# Patient Record
Sex: Male | Born: 1965 | Race: White | Hispanic: No | Marital: Married | Smoking: Former smoker
Health system: Southern US, Community
[De-identification: ages and names within clinical notes are randomized; demographics above are authoritative.]

## PROBLEM LIST (undated history)

## (undated) DIAGNOSIS — K589 Irritable bowel syndrome without diarrhea: Secondary | ICD-10-CM

## (undated) DIAGNOSIS — E785 Hyperlipidemia, unspecified: Secondary | ICD-10-CM

## (undated) DIAGNOSIS — I491 Atrial premature depolarization: Secondary | ICD-10-CM

## (undated) HISTORY — DX: Atrial premature depolarization: I49.1

## (undated) HISTORY — DX: Irritable bowel syndrome, unspecified: K58.9

---

## 2005-07-06 ENCOUNTER — Encounter: Admission: RE | Admit: 2005-07-06 | Discharge: 2005-07-06 | Payer: Self-pay | Admitting: Family Medicine

## 2007-10-02 ENCOUNTER — Emergency Department (HOSPITAL_COMMUNITY): Admission: EM | Admit: 2007-10-02 | Discharge: 2007-10-02 | Payer: Self-pay | Admitting: Emergency Medicine

## 2008-12-27 HISTORY — PX: INGUINAL HERNIA REPAIR: SUR1180

## 2010-09-15 ENCOUNTER — Emergency Department (HOSPITAL_COMMUNITY): Admission: EM | Admit: 2010-09-15 | Discharge: 2010-09-16 | Payer: Self-pay | Admitting: Emergency Medicine

## 2011-03-11 LAB — DIFFERENTIAL
Basophils Absolute: 0 10*3/uL (ref 0.0–0.1)
Eosinophils Absolute: 0.2 10*3/uL (ref 0.0–0.7)
Eosinophils Relative: 2 % (ref 0–5)
Monocytes Absolute: 0.7 10*3/uL (ref 0.1–1.0)
Monocytes Relative: 7 % (ref 3–12)
Neutro Abs: 6.5 10*3/uL (ref 1.7–7.7)
Neutrophils Relative %: 68 % (ref 43–77)

## 2011-03-11 LAB — CBC
Hemoglobin: 15 g/dL (ref 13.0–17.0)
WBC: 9.6 10*3/uL (ref 4.0–10.5)

## 2011-03-11 LAB — POCT I-STAT, CHEM 8
Creatinine, Ser: 1.2 mg/dL (ref 0.4–1.5)
HCT: 42 % (ref 39.0–52.0)
Hemoglobin: 14.3 g/dL (ref 13.0–17.0)
Potassium: 3.7 mEq/L (ref 3.5–5.1)
Sodium: 141 mEq/L (ref 135–145)

## 2011-03-11 LAB — COMPREHENSIVE METABOLIC PANEL
ALT: 17 U/L (ref 0–53)
AST: 23 U/L (ref 0–37)
Albumin: 4.3 g/dL (ref 3.5–5.2)
Alkaline Phosphatase: 49 U/L (ref 39–117)
Creatinine, Ser: 1.17 mg/dL (ref 0.4–1.5)
GFR calc Af Amer: >60 mL/min (ref >60)
GFR calc non Af Amer: >60 mL/min (ref >60)
Sodium: 138 mEq/L (ref 135–145)
Total Bilirubin: 0.7 mg/dL (ref 0.3–1.2)
Total Protein: 6.6 g/dL (ref 6.0–8.3)

## 2011-03-11 LAB — PROTIME-INR
INR: 0.91 (ref 0.00–1.49)
Prothrombin Time: 12.5 seconds (ref 11.6–15.2)

## 2011-03-11 LAB — SAMPLE TO BLOOD BANK

## 2011-10-07 LAB — DIFFERENTIAL
Basophils Absolute: 0
Basophils Relative: 1
Eosinophils Absolute: 0.2
Neutrophils Relative %: 51

## 2011-10-07 LAB — URINALYSIS, ROUTINE W REFLEX MICROSCOPIC
Bilirubin Urine: NEGATIVE
Glucose, UA: NEGATIVE
Nitrite: NEGATIVE
Specific Gravity, Urine: 1.011
pH: 7

## 2011-10-07 LAB — CBC
HCT: 39.4
MCHC: 35.5
MCV: 85.9
RDW: 12.3
WBC: 4.5

## 2011-10-07 LAB — BASIC METABOLIC PANEL
BUN: 15
Calcium: 9.3
Creatinine, Ser: 1.21
GFR calc non Af Amer: >60
Potassium: 4.1
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
## 2014-01-09 ENCOUNTER — Encounter (HOSPITAL_COMMUNITY): Payer: Self-pay

## 2014-01-09 ENCOUNTER — Ambulatory Visit (HOSPITAL_COMMUNITY)
Admission: RE | Admit: 2014-01-09 | Discharge: 2014-01-09 | Disposition: A | Discharge status: Home / Self Care | Payer: PRIVATE HEALTH INSURANCE | Source: Ambulatory Visit | Attending: Internal Medicine | Admitting: Internal Medicine

## 2014-01-09 VITALS — BP 132/88 | HR 59 | Ht 69.0 in | Wt 196.8 lb

## 2014-01-09 DIAGNOSIS — Z87891 Personal history of nicotine dependence: Secondary | ICD-10-CM | POA: Insufficient documentation

## 2014-01-09 DIAGNOSIS — I498 Other specified cardiac arrhythmias: Secondary | ICD-10-CM | POA: Insufficient documentation

## 2014-01-09 DIAGNOSIS — R002 Palpitations: Secondary | ICD-10-CM

## 2014-01-09 DIAGNOSIS — E785 Hyperlipidemia, unspecified: Secondary | ICD-10-CM | POA: Insufficient documentation

## 2014-01-09 HISTORY — DX: Hyperlipidemia, unspecified: E78.5

## 2014-01-09 LAB — COMPREHENSIVE METABOLIC PANEL
ALBUMIN: 4.4 g/dL (ref 3.5–5.2)
ALT: 15 U/L (ref 0–53)
AST: 17 U/L (ref 0–37)
Alkaline Phosphatase: 54 U/L (ref 39–117)
BILIRUBIN TOTAL: 0.5 mg/dL (ref 0.3–1.2)
BUN: 17 mg/dL (ref 6–23)
CO2: 27 mEq/L (ref 19–32)
Calcium: 9.4 mg/dL (ref 8.4–10.5)
Chloride: 101 mEq/L (ref 96–112)
Creatinine, Ser: 0.97 mg/dL (ref 0.50–1.35)
GFR calc non Af Amer: >90 mL/min (ref >90)
Glucose, Bld: 103 mg/dL — ABNORMAL HIGH (ref 70–99)
POTASSIUM: 4.6 meq/L (ref 3.7–5.3)
SODIUM: 141 meq/L (ref 137–147)
Total Protein: 7.2 g/dL (ref 6.0–8.3)

## 2014-01-09 LAB — TSH: TSH: 1.108 u[IU]/mL (ref 0.350–4.500)

## 2014-01-09 LAB — T4, FREE: Free T4: 1.36 ng/dL (ref 0.80–1.80)

## 2014-01-09 NOTE — Progress Notes (Signed)
Patient ID: Ross [name], male   DOB: 04/16/1966, 48 y.o.   MRN: [id number]  Referring Physician: Jannette SpannerErin [name], Physical [name] Primary Care: Ross BachEagle [name]  HPI:  Ross [name] is a pleasant 48 yo male who was referred to our clinic by Ross [name] for palpitations. He has a history of tobacco use in college. He does not have any know cardiac history. He reports he has noticed that he feels like his heart is skipping that comes and goes that usually lasts a few minutes and then goes away. Feels like the beat is very strong like going to come out of his chest. It happens about 1-2 weeks. The beats come and go and he has not had any episodes of sustained tachypalpitations. No presyncope or syncope.  He is very active and denies any CP, SOB or orthopnea. Rides bicycle 5 hours a week with no issues. Reports he does drink a lot of caffeine, but does not associate irregular beats with caffeine. He does report quite a bit of stress with work.   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  FH: Mother died at age of 46 of PE/DVT          Review of Systems: [y] = yes, [ ]  = no   General: Weight gain Klaus.Mock[N ]; Weight loss [ N]; Anorexia [ ] ; Fatigue [ ] ; Fever [ ] ; Chills [ ] ; Weakness [ ]   Cardiac: Chest pain/pressure Klaus.Mock[N ]; Resting SOB Klaus.Mock[N ]; Exertional SOB Klaus.Mock[N ]; Myer Peerrthopnea [N ]; Pedal Edema [ ] ; Palpitations [ ] ; Syncope [ ] ; Presyncope [ ] ; Paroxysmal nocturnal dyspnea[ ]   Pulmonary: Cough [ ] ; Wheezing[ ] ; Hemoptysis[ ] ; Sputum [ ] ; Snoring [ ]   GI: Vomiting[ ] ; Dysphagia[ ] ; Melena[ ] ; Hematochezia [ ] ; Heartburn[ ] ; Abdominal pain [ ] ; Constipation [ ] ; Diarrhea [ ] ; BRBPR [ ]   GU: Hematuria[ ] ; Dysuria [ ] ; Nocturia[ ]   Vascular: Pain in legs with walking [N ]; Pain in feet with lying flat [ ] ; Non-healing sores [ ] ; Stroke Klaus.Mock[N ]; TIA [ ] ; Slurred speech [ ] ;  Neuro: Headaches[ ] ; Vertigo[ ] ; Seizures[ ] ; Paresthesias[ ] ;Blurred  vision [ ] ; Diplopia [ ] ; Vision changes [ ]   Ortho/Skin: Arthritis [ ] ; Joint pain [ ] ; Muscle pain [ ] ; Joint swelling [ ] ; Back Pain [ ] ; Rash [ ]   Psych: Depression[ N]; Anxiety[ ]   Heme: Bleeding problems [ ] ; Clotting disorders [ ] ; Anemia [ ]   Endocrine: Diabetes [ ] ; Thyroid dysfunction[ ]    Past Medical History  Diagnosis Date  . Hyperlipidemia     Current Outpatient Prescriptions  Medication Sig Dispense Refill  . Multiple Vitamin (MULTI VITAMIN DAILY PO) Take by mouth.       No current facility-administered medications for this encounter.    No Known Allergies  History   Social History  . Marital Status: Married    Spouse Name: N/A    Number of Children: N/A  . Years of Education: N/A   Occupational History  . Not on file.   Social History Main Topics  . Smoking status: Former Smoker    Quit date: 12/27/1998  . Smokeless tobacco: Not on file  . Alcohol Use: Yes  . Drug Use: No  . Sexual Activity: Not on file   Other Topics Concern  . Not on file   Social History Narrative  . No narrative on file    Family  History  Problem Relation Age of Onset  . Deep vein thrombosis Mother   . Pulmonary embolism Mother      Ross [name]:   01/09/14 1136  BP: 132/88  Pulse: 59  Height: 5\' 9"  (1.753 m)  Weight: 196 lb 12.8 oz (89.268 kg)  SpO2: 97%    PHYSICAL EXAM: General:  Well appearing. No respiratory difficulty HEENT: normal Neck: supple. no JVD. Carotids 2+ bilat; no bruits. No lymphadenopathy or thryomegaly appreciated. Cor: PMI nondisplaced. Regular rate & rhythm. No rubs, gallops or murmurs. Lungs: clear Abdomen: soft, nontender, nondistended. No hepatosplenomegaly. No bruits or masses. Good bowel sounds. Extremities: no cyanosis, clubbing, rash, edema Neuro: alert & oriented x 3, cranial nerves grossly intact. moves all 4 extremities w/o difficulty. Affect pleasant.  ECG: SB 52 BPM, TWI III and aVF. Normal axis and intervals. No old.    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 - Order ECHO. -Will not order monitor currently, but can consider in the future if he continues to struggle with PVCs. - F/U as needed.  Ulla Potash B NP-C 12:16 PM  Patient seen and examined with Ulla Potash, NP. We discussed all aspects of the encounter. I agree with the assessment and plan as stated above.  Palpitations are likely due to PVCs. Reassured him that these were usually benign. We discussed possible triggers including caffeine, stress, lack of sleep, sleep-disordered breathing and electrolyte abnormalities. We will check echo, BMET, TFTs. If symptoms get worse can consider event monitor. Will see back as needed.   Valente Fosberg,MD 1:22 PM

## 2014-01-09 NOTE — Patient Instructions (Signed)
Will call with lab results.  Will schedule ECHO.  Call any issues [phone]  Try to cut back on caffeine and get rest.

## 2014-01-13 DIAGNOSIS — R002 Palpitations: Secondary | ICD-10-CM | POA: Insufficient documentation

## 2014-01-22 ENCOUNTER — Ambulatory Visit (HOSPITAL_COMMUNITY)
Admission: RE | Admit: 2014-01-22 | Discharge: 2014-01-22 | Disposition: A | Discharge status: Home / Self Care | Payer: PRIVATE HEALTH INSURANCE | Source: Ambulatory Visit | Attending: Internal Medicine | Admitting: Internal Medicine

## 2014-01-22 DIAGNOSIS — I059 Rheumatic mitral valve disease, unspecified: Secondary | ICD-10-CM | POA: Insufficient documentation

## 2014-01-22 DIAGNOSIS — R002 Palpitations: Secondary | ICD-10-CM | POA: Insufficient documentation

## 2014-01-22 DIAGNOSIS — I517 Cardiomegaly: Secondary | ICD-10-CM

## 2014-01-22 DIAGNOSIS — Z87891 Personal history of nicotine dependence: Secondary | ICD-10-CM | POA: Insufficient documentation

## 2014-02-15 ENCOUNTER — Telehealth (HOSPITAL_COMMUNITY): Payer: Self-pay | Admitting: Anesthesiology

## 2014-02-15 NOTE — Telephone Encounter (Signed)
Tried to call and let him know ECHO was normal. He has some diastolic dysfunction, however nothing to be concerned about. Number disconnected and no other numbers on chart.

## 2014-04-01 ENCOUNTER — Telehealth (HOSPITAL_COMMUNITY): Payer: Self-pay | Admitting: Cardiology

## 2014-04-01 NOTE — Telephone Encounter (Signed)
Spoke w/pt he states he rides bike frequently and past 2 weekends while racing he feels like when HR increases he is having more PVCs and he has to slow down and then it improves, will discuss with Dr Gala RomneyBensimhon and call pt back

## 2014-04-01 NOTE — Telephone Encounter (Signed)
Per Dr Gala RomneyBensimhon, order GXT to r/o any ischemic changes, Chantel will you please let him know and schedule

## 2014-04-01 NOTE — Telephone Encounter (Signed)
Pt was told at last ov he had PVC's  Pt has questions regarding PVC's and recent changes during exercise

## 2014-04-03 ENCOUNTER — Other Ambulatory Visit (HOSPITAL_COMMUNITY): Payer: Self-pay | Admitting: Cardiology

## 2014-04-03 DIAGNOSIS — R002 Palpitations: Secondary | ICD-10-CM

## 2014-04-03 NOTE — Telephone Encounter (Signed)
Pt scheduled for GXT stress test cpt  93015 icd 9 785.1 With pts current insurance [employer]- no pre cert is required per Lilia ArgueJulia W 04/03/14

## 2014-04-11 ENCOUNTER — Telehealth (HOSPITAL_COMMUNITY): Payer: Self-pay

## 2014-04-16 ENCOUNTER — Encounter (HOSPITAL_COMMUNITY): Payer: PRIVATE HEALTH INSURANCE

## 2014-04-16 ENCOUNTER — Ambulatory Visit (HOSPITAL_COMMUNITY)
Admission: RE | Admit: 2014-04-16 | Discharge: 2014-04-16 | Disposition: A | Discharge status: Home / Self Care | Payer: PRIVATE HEALTH INSURANCE | Source: Ambulatory Visit | Attending: Cardiology | Admitting: Cardiology

## 2014-04-16 DIAGNOSIS — I4949 Other premature depolarization: Secondary | ICD-10-CM

## 2014-04-16 DIAGNOSIS — R002 Palpitations: Secondary | ICD-10-CM

## 2014-04-22 ENCOUNTER — Encounter: Payer: Self-pay | Admitting: Internal Medicine

## 2014-06-20 ENCOUNTER — Telehealth (HOSPITAL_COMMUNITY): Payer: Self-pay | Admitting: Vascular Surgery

## 2014-06-20 NOTE — Telephone Encounter (Signed)
i called and discussed results of stress test with him. Given ongoing DOE may consider CPX testing.

## 2014-06-20 NOTE — Telephone Encounter (Signed)
Will send to Dr Bensimhon  

## 2014-06-20 NOTE — Telephone Encounter (Signed)
Pt left message he would like a call back from Dr. Gala RomneyBensimhon  For test results and he would like to talk to him about things that happen with his heart when he is  Exercising.

## 2014-07-11 NOTE — Telephone Encounter (Signed)
Encounter complete. 

## 2014-08-20 ENCOUNTER — Telehealth (HOSPITAL_COMMUNITY): Payer: Self-pay | Admitting: Vascular Surgery

## 2014-08-20 NOTE — Telephone Encounter (Signed)
Pt wants to talk to [facility] before making an appointment .Marland Kitchen Please advise

## 2014-08-21 NOTE — Telephone Encounter (Signed)
Left message to call back  

## 2014-08-21 NOTE — Telephone Encounter (Signed)
Dr Gala Romney spoke w/pt, f/u appt scheduled

## 2014-08-28 ENCOUNTER — Encounter (HOSPITAL_COMMUNITY): Payer: Self-pay

## 2014-08-28 ENCOUNTER — Ambulatory Visit (HOSPITAL_COMMUNITY)
Admission: RE | Admit: 2014-08-28 | Discharge: 2014-08-28 | Disposition: A | Discharge status: Home / Self Care | Payer: PRIVATE HEALTH INSURANCE | Source: Ambulatory Visit | Attending: Cardiology | Admitting: Cardiology

## 2014-08-28 VITALS — BP 126/72 | HR 46 | Ht 69.0 in | Wt 203.4 lb

## 2014-08-28 DIAGNOSIS — R0989 Other specified symptoms and signs involving the circulatory and respiratory systems: Secondary | ICD-10-CM

## 2014-08-28 DIAGNOSIS — R0609 Other forms of dyspnea: Secondary | ICD-10-CM

## 2014-08-28 DIAGNOSIS — E785 Hyperlipidemia, unspecified: Secondary | ICD-10-CM | POA: Insufficient documentation

## 2014-08-28 DIAGNOSIS — R002 Palpitations: Secondary | ICD-10-CM | POA: Insufficient documentation

## 2014-08-28 NOTE — Progress Notes (Signed)
Patient ID: MYKLE PASCUA, male   DOB: May 15, 1966, 48 y.o.   MRN: [id number]  Referring Physician: Jannette Spanner, Physical Therapist Primary Care: Alfredo Bach  HPI:  Cleone Slim 48 yo male who was referred to our clinic in 1/15 by Dr. Denice Paradise for palpitations and exertional fatigue. He has a history of tobacco use in college. He does not have any know cardiac history. He rides bikes competitively.   In April underwent ETT: Went 15:24 on Bruce protocol. No CP. ECG normal. Occasional PVCs reported in recover but not captured on a available tracings.  Returns today due to worsening exercise tolerance. About 30 mins into race feels his heartbeats are different. On heart monitor rate is stable 160-170. Feels his heart is skipping. Then has to pull back to get HR down to 150. No chest pain. At rest still with some PVCs.   Echo 1/15: EF 55% grade 2 diastolic dysfunction. Normal RV   TFTs: 1/15 normal.   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  FH: Mother died at age of 74 of PE/DVT          Past Medical History  Diagnosis Date  . Hyperlipidemia     Filed Vitals:   08/28/14 1504  BP: 126/72  Pulse: 46  Height:  (1.753 m)  Weight: 203 lb 6.4 oz (92.262 kg)  SpO2: 97%    PHYSICAL EXAM: General:  Well appearing. No respiratory difficulty HEENT: normal Neck: supple. no JVD. Carotids 2+ bilat; no bruits. No lymphadenopathy or thryomegaly appreciated. Cor: PMI nondisplaced. Regular rate & rhythm. No rubs, gallops or murmurs. Lungs: clear Abdomen: soft, nontender, nondistended. No hepatosplenomegaly. No bruits or masses. Good bowel sounds. Extremities: no cyanosis, clubbing, rash, edema Neuro: alert & oriented x 3, cranial nerves grossly intact. moves all 4 extremities w/o difficulty. Affect pleasant.  ECG: SB 52 BPM, TWI III and aVF. Normal axis and intervals. No old.   ASSESSMENT & PLAN:  1) Exertional  dyspnea and fatigue    --Normal ETT and echo  2) Palpitations  We had a long talk about possible causes for his exercise intolerance. My suspicion is that this is just part of the normal age-related decline in exercise tolerance. His performance on ETT was very good with no signs of ischemia.We dicussed CPX testing vs cardiac CT vs event monitor. Given PVCs and palpitations will start with event monitor.   Tarvares Lant,MD 11:52 PM

## 2014-09-03 ENCOUNTER — Encounter: Payer: Self-pay | Admitting: *Deleted

## 2014-09-03 ENCOUNTER — Encounter (INDEPENDENT_AMBULATORY_CARE_PROVIDER_SITE_OTHER): Payer: PRIVATE HEALTH INSURANCE

## 2014-09-03 DIAGNOSIS — I4949 Other premature depolarization: Secondary | ICD-10-CM

## 2014-09-03 DIAGNOSIS — R002 Palpitations: Secondary | ICD-10-CM

## 2014-09-03 NOTE — Progress Notes (Signed)
Patient ID: Ross [name], male   DOB: 23-Nov-1966, 48 y.o.   MRN: [id number] [employer] 30 day cardiac event monitor applied to patient.  Patient instructed on skin prep and given alcohol wipes, tincture of benzoin to try for better adherence during exercise.

## 2014-09-07 DIAGNOSIS — R0609 Other forms of dyspnea: Secondary | ICD-10-CM | POA: Insufficient documentation

## 2014-09-16 ENCOUNTER — Telehealth (HOSPITAL_COMMUNITY): Payer: Self-pay | Admitting: Vascular Surgery

## 2014-09-16 NOTE — Telephone Encounter (Addendum)
PT is wearing a portable HR monitor he is going [place] of [place] 2 [place] he wants to know what he should do about the monitor.. Please advise pt called back again today about portable monitor please advise he is leaving very soon

## 2014-09-18 NOTE — Telephone Encounter (Signed)
Discussed w/Shelley in Holter room, she states pt will need to notify [employer] of the dates he will be gone and ask them to resume his time period when he returns, pt aware and agreeable

## 2014-10-11 ENCOUNTER — Telehealth (HOSPITAL_COMMUNITY): Payer: Self-pay

## 2014-10-11 NOTE — Telephone Encounter (Signed)
Patient made aware of cardiac event monitor results.  Patient remains concerned that he is having PVCs and exercise intolerance, "i know something is wrong, i feel palpitations and missed/skipped beats when im active".  Will forward this to Dr. Gala RomneyBensimhon to proceed with other tests if indicated.  Patient aware and appreciative. Ave FilterBradley, Megan Genevea

## 2014-12-02 ENCOUNTER — Telehealth (HOSPITAL_COMMUNITY): Payer: Self-pay | Admitting: Cardiology

## 2014-12-02 DIAGNOSIS — R002 Palpitations: Secondary | ICD-10-CM

## 2014-12-02 DIAGNOSIS — I493 Ventricular premature depolarization: Secondary | ICD-10-CM

## 2014-12-02 NOTE — Telephone Encounter (Signed)
Pt had event monitor placed 09/03/14 and was notified of results on 10/11/15, will send to Dr Gala RomneyBensimhon to call pt

## 2014-12-02 NOTE — Telephone Encounter (Signed)
Pt is requesting a return call directly from provider to review monitor results. Pt would like to know the next steps regarding treatment/therapy etc. Pt offered next available appt wit Dr.Bensimhon 12/31, pt declined and requested a call instead  Please advise

## 2014-12-03 ENCOUNTER — Telehealth (HOSPITAL_COMMUNITY): Payer: Self-pay | Admitting: Vascular Surgery

## 2014-12-03 NOTE — Telephone Encounter (Signed)
PT called [facility].Marland Kitchen. PLEASE ADVISE

## 2014-12-04 NOTE — Telephone Encounter (Signed)
Spoke w/Dr Bensimhon, he states he will call pt

## 2014-12-04 NOTE — Telephone Encounter (Signed)
See phone note 1/27

## 2014-12-10 ENCOUNTER — Telehealth (HOSPITAL_COMMUNITY): Payer: Self-pay | Admitting: Vascular Surgery

## 2014-12-13 NOTE — Telephone Encounter (Signed)
Dr Gala RomneyBensimhon spoke w/pt and would like to refer pt to Dr Johney FrameAllred as he continues to have frequent PVC's which really bother him during exercise, referral placed

## 2014-12-30 NOTE — Telephone Encounter (Signed)
Open in error

## 2015-01-15 ENCOUNTER — Institutional Professional Consult (permissible substitution): Payer: PRIVATE HEALTH INSURANCE | Admitting: Internal Medicine

## 2015-01-17 ENCOUNTER — Encounter (HOSPITAL_COMMUNITY): Payer: Self-pay | Admitting: *Deleted

## 2015-01-17 ENCOUNTER — Emergency Department (HOSPITAL_COMMUNITY): Payer: PRIVATE HEALTH INSURANCE

## 2015-01-17 ENCOUNTER — Emergency Department (HOSPITAL_COMMUNITY)
Admission: EM | Admit: 2015-01-17 | Discharge: 2015-01-17 | Disposition: A | Discharge status: Home / Self Care | Payer: PRIVATE HEALTH INSURANCE | Attending: Emergency Medicine | Admitting: Emergency Medicine

## 2015-01-17 DIAGNOSIS — J189 Pneumonia, unspecified organism: Secondary | ICD-10-CM

## 2015-01-17 DIAGNOSIS — Z8679 Personal history of other diseases of the circulatory system: Secondary | ICD-10-CM | POA: Diagnosis not present

## 2015-01-17 DIAGNOSIS — Z79899 Other long term (current) drug therapy: Secondary | ICD-10-CM | POA: Diagnosis not present

## 2015-01-17 DIAGNOSIS — Z87891 Personal history of nicotine dependence: Secondary | ICD-10-CM | POA: Diagnosis not present

## 2015-01-17 DIAGNOSIS — R05 Cough: Secondary | ICD-10-CM

## 2015-01-17 DIAGNOSIS — Z8639 Personal history of other endocrine, nutritional and metabolic disease: Secondary | ICD-10-CM | POA: Diagnosis not present

## 2015-01-17 DIAGNOSIS — R059 Cough, unspecified: Secondary | ICD-10-CM

## 2015-01-17 DIAGNOSIS — J159 Unspecified bacterial pneumonia: Secondary | ICD-10-CM | POA: Insufficient documentation

## 2015-01-17 LAB — CBC WITH DIFFERENTIAL/PLATELET
Basophils Absolute: 0 10*3/uL (ref 0.0–0.1)
Basophils Relative: 0 % (ref 0–1)
EOS PCT: 4 % (ref 0–5)
Eosinophils Absolute: 0.3 10*3/uL (ref 0.0–0.7)
HEMATOCRIT: 41.2 % (ref 39.0–52.0)
Hemoglobin: 14.5 g/dL (ref 13.0–17.0)
Lymphocytes Relative: 12 % (ref 12–46)
Lymphs Abs: 0.8 10*3/uL (ref 0.7–4.0)
MCH: 30.5 pg (ref 26.0–34.0)
MCHC: 35.2 g/dL (ref 30.0–36.0)
MCV: 86.7 fL (ref 78.0–100.0)
Monocytes Absolute: 1 10*3/uL (ref 0.1–1.0)
Monocytes Relative: 15 % — ABNORMAL HIGH (ref 3–12)
NEUTROS PCT: 69 % (ref 43–77)
Neutro Abs: 4.9 10*3/uL (ref 1.7–7.7)
Platelets: 194 10*3/uL (ref 150–400)
RBC: 4.75 MIL/uL (ref 4.22–5.81)
RDW: 11.9 % (ref 11.5–15.5)
WBC: 7 10*3/uL (ref 4.0–10.5)

## 2015-01-17 LAB — BASIC METABOLIC PANEL
ANION GAP: 7 (ref 5–15)
BUN: 18 mg/dL (ref 6–23)
CALCIUM: 9 mg/dL (ref 8.4–10.5)
CHLORIDE: 103 mmol/L (ref 96–112)
CO2: 28 mmol/L (ref 19–32)
CREATININE: 1.05 mg/dL (ref 0.50–1.35)
GFR calc Af Amer: >90 mL/min (ref >90)
GFR calc non Af Amer: 83 mL/min — ABNORMAL LOW (ref >90)
GLUCOSE: 133 mg/dL — AB (ref 70–99)
Potassium: 3.9 mmol/L (ref 3.5–5.1)
Sodium: 138 mmol/L (ref 135–145)

## 2015-01-17 MED ORDER — AZITHROMYCIN 250 MG PO TABS: 250.0000 mg | ORAL_TABLET | Freq: Every day | ORAL | Status: DC

## 2015-01-17 NOTE — ED Notes (Signed)
Companion at bedside with pt.

## 2015-01-17 NOTE — Discharge Instructions (Signed)

## 2015-01-17 NOTE — ED Provider Notes (Signed)
CSN: [id number]     Arrival date & time 01/17/15  1529 History   First MD Initiated Contact with Patient 01/17/15 1541     Chief Complaint  Patient presents with  . Generalized Body Aches  . Fever  . Cough     (Consider location/radiation/quality/duration/timing/severity/associated sxs/prior Treatment) HPI Comments: Patient with PMH of HL and PVCs presents to the ED with a chief complaint of cough, fever, and generalized body aches.  Patient states that the symptoms started this morning.  He states that he took his temperature and it was 102.  He states that he has tried taking advil with some relief.  He reports some chest pain with cough.  He denies feeling short of breath.  Denies any N/V/D.  Additionally, he states that he recently returned from [place], but he denies any history of PE/DVT.  Denies any unilateral leg swelling.  The history is provided by the patient. No language interpreter was used.    Past Medical History  Diagnosis Date  . Hyperlipidemia   . PVC's (premature ventricular contractions)    Past Surgical History  Procedure Laterality Date  . Inguinal hernia repair  2010   Family History  Problem Relation Age of Onset  . Deep vein thrombosis Mother   . Pulmonary embolism Mother    History  Substance Use Topics  . Smoking status: Former Smoker    Quit date: 12/27/1998  . Smokeless tobacco: Not on file  . Alcohol Use: Yes    Review of Systems  Constitutional: Positive for fever. Negative for chills.  Respiratory: Positive for cough. Negative for shortness of breath.   Cardiovascular: Negative for chest pain.  Gastrointestinal: Negative for nausea, vomiting, diarrhea and constipation.  Genitourinary: Negative for dysuria.  All other systems reviewed and are negative.     Allergies  Review of patient's allergies indicates no known allergies.  Home Medications   Prior to Admission medications   Medication Sig Start Date End Date Taking?  Authorizing Provider  Multiple Vitamin (MULTI VITAMIN DAILY PO) Take by mouth.    Historical Provider, MD   BP 161/84 mmHg  Pulse 84  Temp(Src) 98.8 F (37.1 C) (Oral)  Resp 20  SpO2 94% Physical Exam  Constitutional: He is oriented to person, place, and time. He appears well-developed and well-nourished.  HENT:  Head: Normocephalic and atraumatic.  Eyes: Conjunctivae and EOM are normal. Pupils are equal, round, and reactive to light. Right eye exhibits no discharge. Left eye exhibits no discharge. No scleral icterus.  Neck: Normal range of motion. Neck supple. No JVD present.  Cardiovascular: Normal rate, regular rhythm and normal heart sounds.  Exam reveals no gallop and no friction rub.   No murmur heard. Pulmonary/Chest: Effort normal and breath sounds normal. No respiratory distress. He has no wheezes. He has no rales. He exhibits no tenderness.  Abdominal: Soft. He exhibits no distension and no mass. There is no tenderness. There is no rebound and no guarding.  Musculoskeletal: Normal range of motion. He exhibits no edema or tenderness.  Neurological: He is alert and oriented to person, place, and time.  Skin: Skin is warm and dry.  Psychiatric: He has a normal mood and affect. His behavior is normal. Judgment and thought content normal.  Nursing note and vitals reviewed.   ED Course  Procedures (including critical care time) Results for orders placed or performed during the hospital encounter of 01/17/15  CBC with Differential/Platelet  Result Value Ref Range   WBC 7.0  4.0 - 10.5 K/uL   RBC 4.75 4.22 - 5.81 MIL/uL   Hemoglobin 14.5 13.0 - 17.0 g/dL   HCT 16.1 09.6 - 04.5 %   MCV 86.7 78.0 - 100.0 fL   MCH 30.5 26.0 - 34.0 pg   MCHC 35.2 30.0 - 36.0 g/dL   RDW 40.9 81.1 - 91.4 %   Platelets 194 150 - 400 K/uL   Neutrophils Relative % 69 43 - 77 %   Neutro Abs 4.9 1.7 - 7.7 K/uL   Lymphocytes Relative 12 12 - 46 %   Lymphs Abs 0.8 0.7 - 4.0 K/uL   Monocytes Relative  15 (H) 3 - 12 %   Monocytes Absolute 1.0 0.1 - 1.0 K/uL   Eosinophils Relative 4 0 - 5 %   Eosinophils Absolute 0.3 0.0 - 0.7 K/uL   Basophils Relative 0 0 - 1 %   Basophils Absolute 0.0 0.0 - 0.1 K/uL  Basic metabolic panel  Result Value Ref Range   Sodium 138 135 - 145 mmol/L   Potassium 3.9 3.5 - 5.1 mmol/L   Chloride 103 96 - 112 mmol/L   CO2 28 19 - 32 mmol/L   Glucose, Bld 133 (H) 70 - 99 mg/dL   BUN 18 6 - 23 mg/dL   Creatinine, Ser 7.82 0.50 - 1.35 mg/dL   Calcium 9.0 8.4 - 95.6 mg/dL   GFR calc non Af Amer 83 (L) >90 mL/min   GFR calc Af Amer >90 >90 mL/min   Anion gap 7 5 - 15   Dg Chest 2 View  01/17/2015   CLINICAL DATA:  Fever and cough since this morning  EXAM: CHEST  2 VIEW  COMPARISON:  09/15/2010  FINDINGS: Cardiac shadow is within normal limits. The lungs are well aerated but demonstrate evidence of mild left lower lobe infiltrate. No sizable effusion is seen. No bony abnormality is noted.  IMPRESSION: Early left lower lobe infiltrate.   Electronically Signed   By: Alcide Clever M.D.   On: 01/17/2015 16:25    Images reviewed in the PACS system by me personally, I agree with radiologist's impression.   EKG Interpretation None      MDM   Final diagnoses:  Cough  CAP (community acquired pneumonia)    Patient with cough, fever, and generalized body aches.  I suspect that this is viral.  Highly doubt PE.  Patient not hypoxic, nor tachycardic.  Will check labs and CXR.  Will reassess.  4:46 PM Labs are reassuring.  CXR remarkable for early left infiltrate.  This is consistent with the patient's symptoms.  Will treat for CAP with z-pak and delsym.  Will recommend PCP follow-up.  Return if symptoms worsen.  No indication for hospitalization at this time.  Patient is well appearing and non-toxic.  Ambulates maintaining >90% O2 sat.  Filed Vitals:   01/17/15 1537  BP: 161/84  Pulse: 84  Temp: 98.8 F (37.1 C)  Resp: [address]     Roxy Horseman, PA-C 01/17/15  1700  Geoffery Lyons, MD 01/17/15 1911

## 2015-01-17 NOTE — ED Notes (Signed)
Patient woke up today feeling bad with chills, fever, and body aches. He took and advil at home after oral temp 102. Patient has not been around anyone sick, but recently returned from [place]. This may or may not be related.

## 2015-01-20 ENCOUNTER — Institutional Professional Consult (permissible substitution): Payer: PRIVATE HEALTH INSURANCE | Admitting: Internal Medicine

## 2015-02-10 ENCOUNTER — Institutional Professional Consult (permissible substitution): Payer: PRIVATE HEALTH INSURANCE | Admitting: Internal Medicine

## 2015-02-12 ENCOUNTER — Encounter: Payer: Self-pay | Admitting: Internal Medicine

## 2015-02-12 ENCOUNTER — Ambulatory Visit (INDEPENDENT_AMBULATORY_CARE_PROVIDER_SITE_OTHER): Payer: PRIVATE HEALTH INSURANCE | Admitting: Internal Medicine

## 2015-02-12 VITALS — BP 120/78 | HR 51 | Ht 70.0 in | Wt 198.6 lb

## 2015-02-12 DIAGNOSIS — I493 Ventricular premature depolarization: Secondary | ICD-10-CM

## 2015-02-12 DIAGNOSIS — R002 Palpitations: Secondary | ICD-10-CM

## 2015-02-12 DIAGNOSIS — I491 Atrial premature depolarization: Secondary | ICD-10-CM

## 2015-02-12 MED ORDER — METOPROLOL TARTRATE 25 MG PO TABS: ORAL_TABLET | ORAL | Status: DC

## 2015-02-12 NOTE — Patient Instructions (Signed)
Your physician recommends that you schedule a follow-up appointment in: 2 months with Dr Johney FrameAllred   Your physician has recommended you make the following change in your medication:  1) Take Metoprolol 25mg  every 6 hours as needed for palpitations

## 2015-02-12 NOTE — Progress Notes (Signed)
Electrophysiology Office Note   Date:  02/13/2015   ID:  Ross [name], DOB 1966/08/06, MRN [id number]  PCP:   Lacey Jensen (hasnt been in several years) Cardiologist:  Dr Gala Romney Primary Electrophysiologist: Hillis Range, MD    Chief Complaint  Patient presents with  . Palpitations     History of Present Illness: Ross [name] is a 49 y.o. male who presents today for electrophysiology evaluation.   He presents for EP consultation regarding frequent ectopy.  He reports that in spring of 2015 he began noticing reduced exercise tolerance during mountain bike races.  He also noticed occasional palpitations at night when in bed.  Episodes come is waves from time to time.  He has recently reduced caffeine and ETOh intake and has found that episodes have improved.  He feels that his palpitations have negatively impacted his quality of life.  He underwent GXT which revealed a preserved exercise tolerance.  Frequent atrial ectopy was noted.  This was more noticeable during recovery.  He did not have ischemic changes.  He also has worn an event monitor which reveals episodic PACs.  Today, he denies symptoms of chest pain, shortness of breath, orthopnea, PND, lower extremity edema, claudication, dizziness, presyncope, syncope, bleeding, or neurologic sequela. The patient is tolerating medications without difficulties and is otherwise without complaint today.    Past Medical History  Diagnosis Date  . Hyperlipidemia   . Premature atrial contraction   . IBS (irritable bowel syndrome)    Past Surgical History  Procedure Laterality Date  . Inguinal hernia repair  2010     Current Outpatient Prescriptions  Medication Sig Dispense Refill  . Coenzyme Q10 (CO Q 10) 100 MG CAPS Take 1 capsule by mouth as directed.    Marland Kitchen ibuprofen (ADVIL,MOTRIN) 200 MG tablet Take 600 mg by mouth every 6 (six) hours as needed for fever or moderate pain.    . Magnesium 500 MG CAPS Take 1 capsule by  mouth daily.    . Omega-3 Fatty Acids (FISH OIL) 1000 MG CAPS Take 1 capsule by mouth as directed.    Marland Kitchen OVER THE COUNTER MEDICATION Take 1 capsule by mouth daily. L-Carnitine 500 mg    . metoprolol tartrate (LOPRESSOR) 25 MG tablet Take one tablet by mouth as needed for palpitations every 6 hours 30 tablet 2   No current facility-administered medications for this visit.    Allergies:   Review of patient's allergies indicates no known allergies.   Social History:  The patient  reports that he quit smoking about 16 years ago. He does not have any smokeless tobacco history on file. He reports that he drinks alcohol. He reports that he does not use illicit drugs.   Family History:  The patient's  family history includes Deep vein thrombosis in his mother; Pulmonary embolism in his mother.  No other FH of sudden death.  ROS:  Please see the history of present illness.   All other systems are reviewed and negative.    PHYSICAL EXAM: VS:  BP 120/78 mmHg  Pulse 51  Ht  (1.778 m)  Wt 198 lb 9.6 oz (90.084 kg)  BMI 28.50 kg/m2 , BMI Body mass index is 28.5 kg/(m^2). GEN: Well nourished, well developed, in no acute distress HEENT: normal Neck: no JVD, carotid bruits, or masses Cardiac: RRR; no murmurs, rubs, or gallops,no edema  Respiratory:  clear to auscultation bilaterally, normal work of breathing GI: soft, nontender, nondistended, + BS MS: no deformity  or atrophy Skin: warm and dry  Neuro:  Strength and sensation are intact Psych: euthymic mood, full affect  EKG:  EKG is ordered today. The ekg ordered today shows sinus rhythm with no arrhythmias   Recent Labs: 01/17/2015: BUN 18; Creatinine 1.05; Hemoglobin 14.5; Platelets 194; Potassium 3.9; Sodium 138    Lipid Panel  No results found for: CHOL, TRIG, HDL, CHOLHDL, VLDL, LDLCALC, LDLDIRECT   Wt Readings from Last 3 Encounters:  02/12/15 198 lb 9.6 oz (90.084 kg)      Other studies Reviewed: Additional studies/  records that were reviewed today include: Dr Melburn PopperBensimhons notes, event monitor, echo, and stress test    ASSESSMENT AND PLAN:  1.  Palpitations I  Have reviewed his ekgs which reveal sinus rhythm.  GXT and event monitor reveal that he does have episodic PACs and nonsustained atach.  I think that his symptoms are more likely atrial in origin than PVCs. This may arise from a pulmonary vein source.  He has reduced caffeine and ETOh intake and this has significantly reduced his arrhythmia burden.  I have therefore reassured the patient. Therapeutic strategies for his frequent ectopy including medicine and ablation were discussed in detail with the patient today. Risk, benefits, and alternatives to EP study and radiofrequency ablation were also discussed in detail today.  At this time, he would like to avoid ablation. I will give him metoprolol to take prn for palpitations.  He can also try taking this prior to mountain biking to see if this reduces his ectopy.  IF he does not tolerate metoprolol, prn cardizem could be considered.  I would reserve daily medical therapy for a significant increase in frequency.  He will continue to consider ablation.  Return in 2 months to follow-up on above  Signed, Hillis RangeJames Lorris Carducci, MD  02/13/2015 9:17 PM     [facility] [address] [place] [address] [phone] (office) [phone] (fax)

## 2015-02-13 ENCOUNTER — Encounter: Payer: Self-pay | Admitting: Internal Medicine

## 2015-02-13 DIAGNOSIS — I491 Atrial premature depolarization: Secondary | ICD-10-CM | POA: Insufficient documentation

## 2015-04-21 ENCOUNTER — Ambulatory Visit: Payer: PRIVATE HEALTH INSURANCE | Admitting: Internal Medicine

## 2015-04-23 ENCOUNTER — Encounter: Payer: Self-pay | Admitting: Internal Medicine

## 2015-05-30 ENCOUNTER — Other Ambulatory Visit: Payer: Self-pay

## 2015-06-02 ENCOUNTER — Encounter: Payer: Self-pay | Admitting: Internal Medicine

## 2015-06-02 ENCOUNTER — Ambulatory Visit (INDEPENDENT_AMBULATORY_CARE_PROVIDER_SITE_OTHER): Payer: PRIVATE HEALTH INSURANCE | Admitting: Internal Medicine

## 2015-06-02 VITALS — BP 124/88 | HR 50 | Ht 69.0 in | Wt 200.0 lb

## 2015-06-02 DIAGNOSIS — R002 Palpitations: Secondary | ICD-10-CM | POA: Diagnosis not present

## 2015-06-02 DIAGNOSIS — I491 Atrial premature depolarization: Secondary | ICD-10-CM

## 2015-06-02 DIAGNOSIS — I493 Ventricular premature depolarization: Secondary | ICD-10-CM

## 2015-06-02 MED ORDER — VERAPAMIL HCL 40 MG PO TABS: ORAL_TABLET | ORAL | Status: DC

## 2015-06-02 NOTE — Progress Notes (Signed)
Electrophysiology Office Note   Date:  06/02/2015   ID:  Ross [name], DOB 07/21/1966, MRN [id number]  PCP:   [facility] (hasnt been in several years) Cardiologist:  Dr Gala RomneyBensimhon Primary Electrophysiologist: Hillis RangeJames Teea Ducey, MD    Chief Complaint  Patient presents with  . Palpitations     History of Present Illness: Ross [name] is a 49 y.o. male who presents today for electrophysiology evaluation.  He continues to have palpitations with exercise (particularly bike riding).  HE has eliminated caffeine with some improvement in routine palpitations.  He tried metoprolol prior to bike riding but did not tolerate this medicine due to fatigue.  His symptoms appear to be due to frequent atrial ectopy/ PACS.  Today, he denies symptoms of chest pain, shortness of breath, orthopnea, PND, lower extremity edema, claudication, dizziness, presyncope, syncope, bleeding, or neurologic sequela. The patient is tolerating medications without difficulties and is otherwise without complaint today.    Past Medical History  Diagnosis Date  . Hyperlipidemia   . Premature atrial contraction   . IBS (irritable bowel syndrome)    Past Surgical History  Procedure Laterality Date  . Inguinal hernia repair  2010     Current Outpatient Prescriptions  Medication Sig Dispense Refill  . ibuprofen (ADVIL,MOTRIN) 200 MG tablet Take 600 mg by mouth every 6 (six) hours as needed for fever or moderate pain.    . Omega-3 Fatty Acids (FISH OIL) 1000 MG CAPS Take 1 capsule by mouth as directed.    . verapamil (CALAN) 40 MG tablet Take 1 by mouth as needed prior to exercise 30 tablet 0   No current facility-administered medications for this visit.    Allergies:   Review of patient's allergies indicates no known allergies.   Social History:  The patient  reports that he quit smoking about 16 years ago. He does not have any smokeless tobacco history on file. He reports that he drinks alcohol. He  reports that he does not use illicit drugs.   Family History:  The patient's  family history includes Deep vein thrombosis in his mother; Pulmonary embolism in his mother.  No other FH of sudden death.  ROS:  Please see the history of present illness.   All other systems are reviewed and negative.    PHYSICAL EXAM: VS:  BP 124/88 mmHg  Pulse 50  Ht 5\' 9"  (1.753 m)  Wt 90.719 kg (200 lb)  BMI 29.52 kg/m2 , BMI Body mass index is 29.52 kg/(m^2). GEN: Well nourished, well developed, in no acute distress HEENT: normal Neck: no JVD, carotid bruits, or masses Cardiac: RRR; no murmurs, rubs, or gallops,no edema  Respiratory:  clear to auscultation bilaterally, normal work of breathing GI: soft, nontender, nondistended, + BS MS: no deformity or atrophy Skin: warm and dry  Neuro:  Strength and sensation are intact Psych: euthymic mood, full affect  EKG:  EKG is ordered today. The ekg ordered today shows sinus rhythm with a single pac   Recent Labs: 01/17/2015: BUN 18; Creatinine 1.05; Hemoglobin 14.5; Platelets 194; Potassium 3.9; Sodium 138    Lipid Panel  No results found for: CHOL, TRIG, HDL, CHOLHDL, VLDL, LDLCALC, LDLDIRECT   Wt Readings from Last 3 Encounters:  06/02/15 90.719 kg (200 lb)  02/12/15 90.084 kg (198 lb 9.6 oz)      Other studies Reviewed: Additional studies/ records that were reviewed today include: Dr Melburn PopperBensimhons notes, event monitor, echo, and stress test    ASSESSMENT AND PLAN:  1.  Palpitations Appear to be due to pacs Not improved with metoprolol. Therapeutic strategies  including medicine (verapamil, diltiazem, and flecainide) and ablation were discussed in detail with the patient today. Risk, benefits, and alternatives to EP study and radiofrequency ablation were also discussed in detail today.  At this time, he would favor additional medical therapy. I will therefore give verapamil   that he can take prior to bike riding. He will return in 4  weeks for follow-up.  If not improved, I think that our options are flecainide or ablation.  I would favor ablation.  Randolm Idol, MD  06/02/2015 9:12 PM     [facility] [address] [place] [address] [phone] (office) [phone] (fax)

## 2015-06-02 NOTE — Patient Instructions (Signed)
Medication Instructions:  Your physician has recommended you make the following change in your medication:  1) Take Verapamil 40mg  --1 tablet prior to exercising   Labwork: None ordered  Testing/Procedures: None ordered  Follow-Up: Your physician recommends that you schedule a follow-up appointment in 4 weeks with Dr Johney FrameAllred   Any Other Special Instructions Will Be Listed Below (If Applicable).

## 2015-07-07 ENCOUNTER — Ambulatory Visit (INDEPENDENT_AMBULATORY_CARE_PROVIDER_SITE_OTHER): Payer: PRIVATE HEALTH INSURANCE | Admitting: Internal Medicine

## 2015-07-07 ENCOUNTER — Encounter: Payer: Self-pay | Admitting: Internal Medicine

## 2015-07-07 VITALS — BP 132/84 | HR 52 | Ht 69.0 in | Wt 203.6 lb

## 2015-07-07 DIAGNOSIS — R002 Palpitations: Secondary | ICD-10-CM | POA: Diagnosis not present

## 2015-07-07 DIAGNOSIS — I491 Atrial premature depolarization: Secondary | ICD-10-CM | POA: Diagnosis not present

## 2015-07-07 NOTE — Progress Notes (Signed)
Electrophysiology Office Note   Date:  07/07/2015   ID:  Ross [name], DOB 04-24-1966, MRN [id number]  PCP:   Lacey Jensen (hasnt been in several years) Cardiologist:  Dr Gala Romney Primary Electrophysiologist: Hillis Range, MD    Chief Complaint  Patient presents with  . PVC  . PAC     History of Present Illness: Ross [name] is a 49 y.o. male who presents today for electrophysiology evaluation.  He continues to have palpitations with exercise (particularly bike riding).  He has eliminated caffeine with some improvement in routine palpitations.  He tried metoprolol and verapamil prior to bike riding but did not find these medicines helpful.  Metoprolol was also associated with fatigue.  His symptoms appear to be due to frequent atrial ectopy/ PACS.  Today, he denies symptoms of chest pain, shortness of breath, orthopnea, PND, lower extremity edema, claudication, dizziness, presyncope, syncope, bleeding, or neurologic sequela. The patient is tolerating medications without difficulties and is otherwise without complaint today.    Past Medical History  Diagnosis Date  . Hyperlipidemia   . Premature atrial contraction   . IBS (irritable bowel syndrome)    Past Surgical History  Procedure Laterality Date  . Inguinal hernia repair  2010     Current Outpatient Prescriptions  Medication Sig Dispense Refill  . ibuprofen (ADVIL,MOTRIN) 200 MG tablet Take 600 mg by mouth every 6 (six) hours as needed for fever or moderate pain.    . Omega-3 Fatty Acids (FISH OIL) 1000 MG CAPS Take 1 capsule by mouth as directed.    . verapamil (CALAN) 40 MG tablet Take 40 mg by mouth daily as needed (Take prior to exercising).     No current facility-administered medications for this visit.    Allergies:   Review of patient's allergies indicates no known allergies.   Social History:  The patient  reports that he quit smoking about 16 years ago. He does not have any smokeless  tobacco history on file. He reports that he drinks alcohol. He reports that he does not use illicit drugs.   Family History:  The patient's  family history includes Deep vein thrombosis in his mother; Pulmonary embolism in his mother.  No other FH of sudden death.  ROS:  Please see the history of present illness.   All other systems are reviewed and negative.    PHYSICAL EXAM: VS:  BP 132/84 mmHg  Pulse 52  Ht  (1.753 m)  Wt 92.352 kg (203 lb 9.6 oz)  BMI 30.05 kg/m2 , BMI Body mass index is 30.05 kg/(m^2). GEN: Well nourished, well developed, in no acute distress HEENT: normal Neck: no JVD, carotid bruits, or masses Cardiac: RRR; no murmurs, rubs, or gallops,no edema  Respiratory:  clear to auscultation bilaterally, normal work of breathing GI: soft, nontender, nondistended, + BS MS: no deformity or atrophy Skin: warm and dry  Neuro:  Strength and sensation are intact Psych: euthymic mood, full affect   Recent Labs: 01/17/2015: BUN 18; Creatinine, Ser 1.05; Hemoglobin 14.5; Platelets 194; Potassium 3.9; Sodium 138    Lipid Panel  No results found for: CHOL, TRIG, HDL, CHOLHDL, VLDL, LDLCALC, LDLDIRECT   Wt Readings from Last 3 Encounters:  07/07/15 92.352 kg (203 lb 9.6 oz)  06/02/15 90.719 kg (200 lb)  02/12/15 90.084 kg (198 lb 9.6 oz)     ASSESSMENT AND PLAN:  1.  Palpitations Appear to be due to pacs Not improved with metoprolol or verpamil. Therapeutic strategies  including medicine (flecainide) and ablation were discussed in detail with the patient  And his spouse today.  Risk, benefits, and alternatives to EP study and radiofrequency ablation were also discussed in detail today. These risks include but are not limited to stroke, bleeding, vascular damage, tamponade, perforation, damage to the heart and other structures, AV block requiring pacemaker, worsening renal function, and death. The patient understands these risk and wishes to proceed.  We will  therefore proceed with catheter ablation at the next available time.  Carto and anesthesia have been requested for the procedure.        Randolm IdolSigned, Suzane Vanderweide, MD  07/07/2015 9:48 PM     [facility] [address] [place] [address] [phone] (office) [phone] (fax)

## 2015-07-07 NOTE — Patient Instructions (Addendum)
Medication Instructions:  - No changes  Labwork: - will schedule once procedure date is set  Testing/Procedures: - Your physician has recommended that you have an ablation. Catheter ablation is a medical procedure used to treat some cardiac arrhythmias (irregular heartbeats). During catheter ablation, a long, thin, flexible tube is put into a blood vessel in your groin (upper thigh), or neck. This tube is called an ablation catheter. It is then guided to your heart through the blood vessel. Radio frequency waves destroy small areas of heart tissue where abnormal heartbeats may cause an arrhythmia to start.   Available dates are: Tuesday 08/19/15 Thursday 08/21/15 Tuesday 08/26/15 Thursday 08/28/15  Please call [phone] and speak with Dennis BastKelly Lanier, RN for Dr. Johney FrameAllred to schedule.  Follow-Up: - will schedule once procedure date is set  Any Other Special Instructions Will Be Listed Below (If Applicable). - none

## 2015-07-08 ENCOUNTER — Telehealth: Payer: Self-pay | Admitting: Internal Medicine

## 2015-07-08 DIAGNOSIS — I471 Supraventricular tachycardia: Secondary | ICD-10-CM

## 2015-07-08 NOTE — Telephone Encounter (Signed)
New Message  Pt called requests a call back to to discuss scheduling the Ablation for 8/25 morning appt.

## 2015-07-09 NOTE — Telephone Encounter (Signed)
Ablation scheduled for 08/21/15 Will need labs 8/18  I have asked he call back to sch apt time for pre op labs

## 2015-07-25 ENCOUNTER — Other Ambulatory Visit: Payer: Self-pay | Admitting: *Deleted

## 2015-07-25 ENCOUNTER — Encounter: Payer: Self-pay | Admitting: *Deleted

## 2015-08-14 ENCOUNTER — Other Ambulatory Visit (INDEPENDENT_AMBULATORY_CARE_PROVIDER_SITE_OTHER): Payer: PRIVATE HEALTH INSURANCE | Admitting: *Deleted

## 2015-08-14 DIAGNOSIS — I471 Supraventricular tachycardia: Secondary | ICD-10-CM | POA: Diagnosis not present

## 2015-08-14 LAB — CBC WITH DIFFERENTIAL/PLATELET
BASOS ABS: 0 10*3/uL (ref 0.0–0.1)
Basophils Relative: 0.5 % (ref 0.0–3.0)
Eosinophils Absolute: 0.2 10*3/uL (ref 0.0–0.7)
Eosinophils Relative: 3.6 % (ref 0.0–5.0)
HCT: 43 % (ref 39.0–52.0)
Hemoglobin: 14.7 g/dL (ref 13.0–17.0)
LYMPHS ABS: 2.1 10*3/uL (ref 0.7–4.0)
Lymphocytes Relative: 34.5 % (ref 12.0–46.0)
MCHC: 34.2 g/dL (ref 30.0–36.0)
MCV: 89.2 fl (ref 78.0–100.0)
MONO ABS: 0.4 10*3/uL (ref 0.1–1.0)
Monocytes Relative: 6.9 % (ref 3.0–12.0)
NEUTROS ABS: 3.3 10*3/uL (ref 1.4–7.7)
NEUTROS PCT: 54.5 % (ref 43.0–77.0)
Platelets: 213 10*3/uL (ref 150.0–400.0)
RBC: 4.82 Mil/uL (ref 4.22–5.81)
RDW: 12.4 % (ref 11.5–15.5)
WBC: 6.1 10*3/uL (ref 4.0–10.5)

## 2015-08-14 LAB — BASIC METABOLIC PANEL
BUN: 18 mg/dL (ref 6–23)
CALCIUM: 9.6 mg/dL (ref 8.4–10.5)
CHLORIDE: 104 meq/L (ref 96–112)
CO2: 31 meq/L (ref 19–32)
CREATININE: 1.03 mg/dL (ref 0.40–1.50)
GFR: 82.35 mL/min (ref >60.00)
Glucose, Bld: 97 mg/dL (ref 70–99)
Potassium: 4.5 mEq/L (ref 3.5–5.1)
SODIUM: 140 meq/L (ref 135–145)

## 2015-08-18 ENCOUNTER — Telehealth: Payer: Self-pay | Admitting: Internal Medicine

## 2015-08-18 NOTE — Telephone Encounter (Signed)
New message     Pt wife states pt is scheduled for ablation on Thursday Pt is wanting to know if he is going to stay in the hospital overnight Please call to discuss

## 2015-08-18 NOTE — Telephone Encounter (Signed)
lmom for wife to plan on spending one night but he may be released

## 2015-08-21 ENCOUNTER — Ambulatory Visit (HOSPITAL_COMMUNITY): Payer: PRIVATE HEALTH INSURANCE | Admitting: Anesthesiology

## 2015-08-21 ENCOUNTER — Ambulatory Visit (HOSPITAL_COMMUNITY)
Admission: RE | Admit: 2015-08-21 | Discharge: 2015-08-22 | Disposition: A | Discharge status: Home / Self Care | Payer: PRIVATE HEALTH INSURANCE | Source: Ambulatory Visit | Attending: Internal Medicine | Admitting: Internal Medicine

## 2015-08-21 ENCOUNTER — Encounter (HOSPITAL_COMMUNITY): Payer: Self-pay | Admitting: Anesthesiology

## 2015-08-21 ENCOUNTER — Encounter (HOSPITAL_COMMUNITY)
Admission: RE | Disposition: A | Discharge status: Home / Self Care | Payer: PRIVATE HEALTH INSURANCE | Source: Ambulatory Visit | Attending: Internal Medicine

## 2015-08-21 DIAGNOSIS — Z6828 Body mass index (BMI) 28.0-28.9, adult: Secondary | ICD-10-CM | POA: Diagnosis not present

## 2015-08-21 DIAGNOSIS — R002 Palpitations: Secondary | ICD-10-CM | POA: Diagnosis present

## 2015-08-21 DIAGNOSIS — Z79899 Other long term (current) drug therapy: Secondary | ICD-10-CM | POA: Diagnosis not present

## 2015-08-21 DIAGNOSIS — E669 Obesity, unspecified: Secondary | ICD-10-CM | POA: Insufficient documentation

## 2015-08-21 DIAGNOSIS — Z791 Long term (current) use of non-steroidal anti-inflammatories (NSAID): Secondary | ICD-10-CM | POA: Insufficient documentation

## 2015-08-21 DIAGNOSIS — I471 Supraventricular tachycardia, unspecified: Secondary | ICD-10-CM

## 2015-08-21 DIAGNOSIS — Z87891 Personal history of nicotine dependence: Secondary | ICD-10-CM | POA: Insufficient documentation

## 2015-08-21 DIAGNOSIS — E785 Hyperlipidemia, unspecified: Secondary | ICD-10-CM | POA: Insufficient documentation

## 2015-08-21 DIAGNOSIS — I4719 Other supraventricular tachycardia: Secondary | ICD-10-CM | POA: Diagnosis present

## 2015-08-21 HISTORY — PX: ELECTROPHYSIOLOGIC STUDY: SHX172A

## 2015-08-21 LAB — MRSA PCR SCREENING: MRSA BY PCR: NEGATIVE

## 2015-08-21 LAB — POCT ACTIVATED CLOTTING TIME
ACTIVATED CLOTTING TIME: 233 s
ACTIVATED CLOTTING TIME: 245 s
Activated Clotting Time: 190 seconds
Activated Clotting Time: 177 seconds

## 2015-08-21 SURGERY — A-FLUTTER/A-TACH/SVT ABLATION
Anesthesia: Monitor Anesthesia Care

## 2015-08-21 MED ORDER — HYDROMORPHONE HCL 1 MG/ML IJ SOLN: 0.2500 mg | INTRAMUSCULAR | Status: DC | PRN

## 2015-08-21 MED ORDER — ONDANSETRON HCL 4 MG/2ML IJ SOLN: 4.0000 mg | Freq: Four times a day (QID) | INTRAMUSCULAR | Status: DC | PRN

## 2015-08-21 MED ORDER — SODIUM CHLORIDE 0.9 % IJ SOLN: 3.0000 mL | INTRAMUSCULAR | Status: DC | PRN

## 2015-08-21 MED ORDER — LIDOCAINE HCL (CARDIAC) 20 MG/ML IV SOLN
INTRAVENOUS | Status: DC | PRN
  Administered 2015-08-21: 40 mg via INTRAVENOUS

## 2015-08-21 MED ORDER — SODIUM CHLORIDE 0.9 % IJ SOLN
3.0000 mL | Freq: Two times a day (BID) | INTRAMUSCULAR | Status: DC
  Administered 2015-08-21 – 2015-08-22 (×2): 3 mL via INTRAVENOUS

## 2015-08-21 MED ORDER — ONDANSETRON HCL 4 MG/2ML IJ SOLN
INTRAMUSCULAR | Status: DC | PRN
Start: 2015-08-21 — End: 2015-08-21
  Administered 2015-08-21: 4 mg via INTRAVENOUS

## 2015-08-21 MED ORDER — ACETAMINOPHEN 325 MG PO TABS: 650.0000 mg | ORAL_TABLET | ORAL | Status: DC | PRN

## 2015-08-21 MED ORDER — HEPARIN SODIUM (PORCINE) 1000 UNIT/ML IJ SOLN
INTRAMUSCULAR | Status: DC | PRN
  Administered 2015-08-21 (×2): 5000 [IU] via INTRAVENOUS

## 2015-08-21 MED ORDER — BUPIVACAINE HCL (PF) 0.25 % IJ SOLN
INTRAMUSCULAR | Status: AC
  Filled 2015-08-21: qty 30

## 2015-08-21 MED ORDER — HEPARIN SODIUM (PORCINE) 1000 UNIT/ML IJ SOLN
INTRAMUSCULAR | Status: DC | PRN
Start: 2015-08-21 — End: 2015-08-21
  Administered 2015-08-21: 13000 [IU] via INTRAVENOUS

## 2015-08-21 MED ORDER — FENTANYL CITRATE (PF) 100 MCG/2ML IJ SOLN
25.0000 ug | Freq: Once | INTRAMUSCULAR | Status: AC
  Administered 2015-08-21: 25 ug via INTRAVENOUS

## 2015-08-21 MED ORDER — HYDROCODONE-ACETAMINOPHEN 5-325 MG PO TABS
ORAL_TABLET | ORAL | Status: AC
  Filled 2015-08-21: qty 2

## 2015-08-21 MED ORDER — APIXABAN 5 MG PO TABS
5.0000 mg | ORAL_TABLET | Freq: Two times a day (BID) | ORAL | Status: DC
  Administered 2015-08-21 – 2015-08-22 (×2): 5 mg via ORAL
  Filled 2015-08-21: qty 1
  Filled 2015-08-21: qty 2

## 2015-08-21 MED ORDER — SODIUM CHLORIDE 0.9 % IV SOLN
2.0000 ug/min | INTRAVENOUS | Status: DC
  Filled 2015-08-21 (×3): qty 2

## 2015-08-21 MED ORDER — FENTANYL CITRATE (PF) 100 MCG/2ML IJ SOLN
INTRAMUSCULAR | Status: DC | PRN
  Administered 2015-08-21 (×8): 25 ug via INTRAVENOUS

## 2015-08-21 MED ORDER — FENTANYL CITRATE (PF) 100 MCG/2ML IJ SOLN
INTRAMUSCULAR | Status: AC
  Filled 2015-08-21: qty 2

## 2015-08-21 MED ORDER — HYDROCODONE-ACETAMINOPHEN 5-325 MG PO TABS
1.0000 | ORAL_TABLET | ORAL | Status: DC | PRN
  Administered 2015-08-21 – 2015-08-22 (×3): 2 via ORAL
  Filled 2015-08-21 (×2): qty 2

## 2015-08-21 MED ORDER — HEPARIN SODIUM (PORCINE) 1000 UNIT/ML IJ SOLN
INTRAMUSCULAR | Status: AC
  Filled 2015-08-21: qty 1

## 2015-08-21 MED ORDER — PROTAMINE SULFATE 10 MG/ML IV SOLN
INTRAVENOUS | Status: DC | PRN
  Administered 2015-08-21 (×3): 10 mg via INTRAVENOUS

## 2015-08-21 MED ORDER — ISOPROTERENOL HCL 0.2 MG/ML IJ SOLN
1000.0000 ug | INTRAVENOUS | Status: DC | PRN
  Administered 2015-08-21: 2 ug/min via INTRAVENOUS

## 2015-08-21 MED ORDER — OXYCODONE HCL 5 MG/5ML PO SOLN
5.0000 mg | Freq: Once | ORAL | Status: AC | PRN
  Administered 2015-08-21: 5 mg via ORAL
  Filled 2015-08-21: qty 5

## 2015-08-21 MED ORDER — OXYCODONE HCL 5 MG PO TABS: 5.0000 mg | ORAL_TABLET | Freq: Once | ORAL | Status: AC | PRN

## 2015-08-21 MED ORDER — LACTATED RINGERS IV SOLN
INTRAVENOUS | Status: DC | PRN
  Administered 2015-08-21 (×2): via INTRAVENOUS

## 2015-08-21 MED ORDER — PROPOFOL 10 MG/ML IV BOLUS
INTRAVENOUS | Status: DC | PRN
  Administered 2015-08-21 (×2): 20 mg via INTRAVENOUS
  Administered 2015-08-21: 150 mg via INTRAVENOUS

## 2015-08-21 MED ORDER — ONDANSETRON HCL 4 MG/2ML IJ SOLN: 4.0000 mg | Freq: Once | INTRAMUSCULAR | Status: DC | PRN

## 2015-08-21 MED ORDER — SODIUM CHLORIDE 0.9 % IV SOLN: 250.0000 mL | INTRAVENOUS | Status: DC | PRN

## 2015-08-21 MED ORDER — MIDAZOLAM HCL 5 MG/5ML IJ SOLN
INTRAMUSCULAR | Status: DC | PRN
  Administered 2015-08-21 (×8): 0.5 mg via INTRAVENOUS

## 2015-08-21 MED ORDER — PROPOFOL INFUSION 10 MG/ML OPTIME
INTRAVENOUS | Status: DC | PRN
  Administered 2015-08-21: 25 ug/kg/min via INTRAVENOUS

## 2015-08-21 SURGICAL SUPPLY — 22 items
BAG SNAP BAND KOVER 36X36 (MISCELLANEOUS) ×3 IMPLANT
BLANKET WARM UNDERBOD FULL ACC (MISCELLANEOUS) ×3 IMPLANT
CATH EZ STEER NAV 4MM D-F CUR (ABLATOR) ×3 IMPLANT
CATH JOSEPHSON QUAD-ALLRED 6FR (CATHETERS) ×6 IMPLANT
CATH NAVISTAR SMARTTOUCH DF (ABLATOR) ×3 IMPLANT
CATH SOUNDSTAR 3D IMAGING (CATHETERS) ×3 IMPLANT
CATH VARIABLE LASSO NAV 2515 (CATHETERS) ×3 IMPLANT
CATH WEBSTER BI DIR CS D-F CRV (CATHETERS) ×3 IMPLANT
COVER SWIFTLINK CONNECTOR (BAG) ×3 IMPLANT
NEEDLE TRANSEP BRK 71CM 407200 (NEEDLE) ×3 IMPLANT
PACK EP LATEX FREE (CUSTOM PROCEDURE TRAY) ×3
PACK EP LF (CUSTOM PROCEDURE TRAY) ×1 IMPLANT
PAD DEFIB LIFELINK (PAD) ×3 IMPLANT
PATCH CARTO3 (PAD) ×3 IMPLANT
SHEATH AVANTI 11F 11CM (SHEATH) ×3 IMPLANT
SHEATH PINNACLE 6F 10CM (SHEATH) ×3 IMPLANT
SHEATH PINNACLE 7F 10CM (SHEATH) ×3 IMPLANT
SHEATH PINNACLE 8F 10CM (SHEATH) ×3 IMPLANT
SHEATH PINNACLE 9F 10CM (SHEATH) ×3 IMPLANT
SHEATH SWARTZ TS SL2 63CM 8.5F (SHEATH) ×3 IMPLANT
SHIELD RADPAD SCOOP 12X17 (MISCELLANEOUS) ×3 IMPLANT
TUBING SMART ABLATE COOLFLOW (TUBING) ×3 IMPLANT

## 2015-08-21 NOTE — Anesthesia Preprocedure Evaluation (Addendum)
Anesthesia Evaluation  Patient identified by MRN, date of birth, ID band Patient awake    Reviewed: Allergy & Precautions, NPO status , Patient's Chart, lab work & pertinent test results  Airway Mallampati: I  TM Distance: >3 FB Neck ROM: Full    Dental  (+) Teeth Intact, Dental Advisory Given   Pulmonary former smoker,  breath sounds clear to auscultation        Cardiovascular Exercise Tolerance: Good Rhythm:Regular Rate:Normal     Neuro/Psych    GI/Hepatic   Endo/Other    Renal/GU      Musculoskeletal   Abdominal (+) + obese,   Peds  Hematology   Anesthesia Other Findings   Reproductive/Obstetrics                           Anesthesia Physical Anesthesia Plan  ASA: II  Anesthesia Plan: MAC   Post-op Pain Management:    Induction:   Airway Management Planned: Natural Airway  Additional Equipment:   Intra-op Plan:   Post-operative Plan: Extubation in OR  Informed Consent: I have reviewed the patients History and Physical, chart, labs and discussed the procedure including the risks, benefits and alternatives for the proposed anesthesia with the patient or authorized representative who has indicated his/her understanding and acceptance.   Dental advisory given  Plan Discussed with: CRNA and Anesthesiologist  Anesthesia Plan Comments:       Anesthesia Quick Evaluation

## 2015-08-21 NOTE — Anesthesia Procedure Notes (Signed)
Procedure Name: LMA Insertion Date/Time: 08/21/2015 9:51 AM Performed by: Darcey Nora B Pre-anesthesia Checklist: Patient identified, Emergency Drugs available and Suction available Patient Re-evaluated:Patient Re-evaluated prior to inductionOxygen Delivery Method: Circle system utilized Preoxygenation: Pre-oxygenation with 100% oxygen Intubation Type: IV induction Ventilation: Mask ventilation without difficulty LMA: LMA inserted LMA Size: 5.0 Number of attempts: 1 (Dr. Noreene Larsson) Tube secured with: Tape (taped across cheeks) Dental Injury: Teeth and Oropharynx as per pre-operative assessment

## 2015-08-21 NOTE — Progress Notes (Signed)
Site area: RFV x 3 Site Prior to Removal:  Level 0 Pressure Applied For: 25 min Manual:  yes  Patient Status During Pull:  stable Post Pull Site:  Level 0 Post Pull Instructions Given:  yes Post Pull Pulses Present: palpable Dressing Applied:  clear Bedrest begins @ 1330 till 1930 Comments:   till

## 2015-08-21 NOTE — Transfer of Care (Signed)
Immediate Anesthesia Transfer of Care Note  Patient: Ross [name]  Procedure(s) Performed: Procedure(s): A-Tach Ablation (N/A)  Patient Location: PACU, Cath Lab and Recovery Bay 3  Anesthesia Type:MAC and General  Level of Consciousness: awake, alert , oriented and patient cooperative  Airway & Oxygen Therapy: Patient Spontanous Breathing, Patient connected to nasal cannula oxygen and complains of low back being sore.  Post-op Assessment: Report given to RN, Post -op Vital signs reviewed and stable and Patient moving all extremities  Post vital signs: Reviewed and stable  Last Vitals:  Filed Vitals:   08/21/15 0543  BP: 134/92  Pulse: 51  Temp: 36.6 C  Resp: 18    Complications: No apparent anesthesia complications

## 2015-08-21 NOTE — H&P (Signed)
  PCP: Lacey Jensen (hasnt been in several years) Cardiologist: Dr Gala Romney Primary Electrophysiologist: Hillis Range, MD              History of Present Illness: Ross [name] is a 49 y.o. male who presents today for EPS and RFA. He continues to have palpitations with exercise (particularly bike riding). He has eliminated caffeine with some improvement in routine palpitations. He tried metoprolol and verapamil prior to bike riding but did not find these medicines helpful. Metoprolol was also associated with fatigue.  His symptoms appear to be due to frequent atrial ectopy/ PACS.  Today, he denies symptoms of chest pain, shortness of breath, orthopnea, PND, lower extremity edema, claudication, dizziness, presyncope, syncope, bleeding, or neurologic sequela. The patient is tolerating medications without difficulties and is otherwise without complaint today.    Past Medical History  Diagnosis Date  . Hyperlipidemia   . Premature atrial contraction   . IBS (irritable bowel syndrome)    Past Surgical History  Procedure Laterality Date  . Inguinal hernia repair  2010     Current Outpatient Prescriptions  Medication Sig Dispense Refill  . ibuprofen (ADVIL,MOTRIN) 200 MG tablet Take 600 mg by mouth every 6 (six) hours as needed for fever or moderate pain.    . Omega-3 Fatty Acids (FISH OIL) 1000 MG CAPS Take 1 capsule by mouth as directed.    . verapamil (CALAN) 40 MG tablet Take 40 mg by mouth daily as needed (Take prior to exercising).     No current facility-administered medications for this visit.    Allergies: Review of patient's allergies indicates no known allergies.   Social History: The patient  reports that he quit smoking about 16 years ago. He does not have any smokeless tobacco history on file. He reports that he drinks alcohol. He reports that he does not use illicit drugs.   Family History: The  patient's family history includes Deep vein thrombosis in his mother; Pulmonary embolism in his mother.  No other FH of sudden death.  ROS: Please see the history of present illness. All other systems are reviewed and negative.    PHYSICAL EXAM: Filed Vitals:   08/21/15 0543  BP: 134/92  Pulse: 51  Temp: 97.9 F (36.6 C)  Resp: 18    GEN: Well nourished, well developed, in no acute distress  HEENT: normal  Neck: no JVD, carotid bruits, or masses Cardiac: RRR; no murmurs, rubs, or gallops,no edema  Respiratory: clear to auscultation bilaterally, normal work of breathing GI: soft, nontender, nondistended, + BS MS: no deformity or atrophy  Skin: warm and dry  Neuro: Strength and sensation are intact Psych: euthymic mood, full affect   ASSESSMENT AND PLAN:  1. Palpitations/ PACs Appear to be due to pacs Not improved with metoprolol or verpamil. Therapeutic strategies for supraventricular tachycardia including medicine and ablation were discussed in detail with the patient today. Risk, benefits, and alternatives to EP study and radiofrequency ablation were also discussed in detail today. These risks include but are not limited to stroke, bleeding, vascular damage, tamponade, perforation, damage to the heart and other structures, AV block requiring pacemaker, worsening renal function, and death. The patient understands these risk and wishes to proceed.     Hillis Range MD, [facility] 08/21/2015 7:03 AM

## 2015-08-21 NOTE — Anesthesia Postprocedure Evaluation (Signed)
  Anesthesia Post-op Note  Patient: Ross [name]  Procedure(s) Performed: Procedure(s): A-Tach Ablation (N/A)  Patient Location: Cath Lab  Anesthesia Type:General  Level of Consciousness: awake, alert  and oriented  Airway and Oxygen Therapy: Patient Spontanous Breathing and Patient connected to nasal cannula oxygen  Post-op Pain: none  Post-op Assessment: Post-op Vital signs reviewed, Patient's Cardiovascular Status Stable, Respiratory Function Stable, Patent Airway and Pain level controlled              Post-op Vital Signs: stable  Last Vitals:  Filed Vitals:   08/21/15 1345  BP: 152/86  Pulse: 72  Temp:   Resp: 17    Complications: No apparent anesthesia complications

## 2015-08-22 ENCOUNTER — Encounter (HOSPITAL_COMMUNITY): Payer: Self-pay | Admitting: Internal Medicine

## 2015-08-22 DIAGNOSIS — I471 Supraventricular tachycardia: Secondary | ICD-10-CM | POA: Diagnosis not present

## 2015-08-22 MED ORDER — PANTOPRAZOLE SODIUM 40 MG PO TBEC: 40.0000 mg | DELAYED_RELEASE_TABLET | Freq: Every day | ORAL | Status: DC

## 2015-08-22 MED ORDER — APIXABAN 5 MG PO TABS: 5.0000 mg | ORAL_TABLET | Freq: Two times a day (BID) | ORAL | Status: DC

## 2015-08-22 MED FILL — Bupivacaine HCl Preservative Free (PF) Inj 0.25%: INTRAMUSCULAR | Qty: 30 | Status: AC

## 2015-08-22 NOTE — Discharge Summary (Signed)
  Physician Discharge Summary  Patient ID: Ross [name] MRN: [id number] DOB/AGE: 01-Jan-1966 49 y.o.   Primary Cardiologist: Dr. Gala Romney Electrophysiologist: Dr. Johney Frame  Admit date: 08/21/2015 Discharge date: 08/22/2015  Admission Diagnoses: Symptomatic and frequent atrial ectopy/ PACS  Discharge Diagnoses:  Active Problems:   Palpitations   Paroxysmal supraventricular tachycardia   Atrial tachycardia   Discharged Condition: stable  Hospital Course: 49 y/o male with h/o frequent atrial ectopy/ PACs for which he has been highly symptomatic, despite medical therapy with metoprolol and verapamil and despite avoidance of triggers. He presented to [facility] on 08/21/15 for elective EP study + radiofrequency ablation. The procedure was performed by Dr. Johney Frame. During EP study, clinical PACs were induced with isuprel and mapped to the right superior pulmonary vein. He then underwent successful electrical isolation and anatomical encircling of the right superior and inferior pulmonary veins. No inducible arrhythmias following ablation both on and off of Isuprel. He tolerated the procedure well and left the EP lab in stable condition. He was monitored overnight. He had no complications and no arhythmias on telemetry. He denied CP and dyspnea. His cath access site remained stable. He was last seen and examined by Dr. Johney Frame who determined he was stable for discharge home. He was prescribed Protonix x 6 weeks and Eliquis x 3 months. F/u in EP clinic will be arranged in 4-6 weeks   Consults: None  Significant Diagnostic Studies/Treatments:  EPS + RFA 08/21/15 CONCLUSIONS: 1. Sinus rhythm upon presentation.  2. Clinical PAC induced with isuprel and mapped to the right superior pulmonary vein 3. Successful electrical isolation and anatomical encircling of the right superior and inferior pulmonary veins using a waca approach 4. No inducible arrhythmias following ablation both on and off of Isuprel 5.  No early apparent complications.   Discharge Exam: Blood pressure 134/92, pulse 54, temperature 98.2 F (36.8 C), temperature source Oral, resp. rate 21, height  (1.778 m), weight 200 lb (90.719 kg), SpO2 96 %. General appearance: alert, cooperative and no distress Neck: no carotid bruit and no JVD Resp: slightly decreased BS over RLL, otherwise CTA Cardio: regular rate and rhythm, S1, S2 normal, no murmur, click, rub or gallop Extremities: no LEE Pulses: 2+ and symmetric Skin: warm and dry Neurologic: Grossly normal  Disposition: 01-Home or Self Care     Medication List    STOP taking these medications        ibuprofen 200 MG tablet  Commonly known as:  ADVIL,MOTRIN      TAKE these medications        apixaban 5 MG Tabs tablet  Commonly known as:  ELIQUIS  Take 1 tablet (5 mg total) by mouth 2 (two) times daily.     pantoprazole 40 MG tablet  Commonly known as:  PROTONIX  Take 1 tablet (40 mg total) by mouth daily.       Follow-up Information    Follow up with Hillis Range, MD.   Specialty:  Cardiology   Why:  our office will call you with a follow-up in 4-6 weeks   Contact information:   [address] [place] [address] [phone]     TIME SPENT ON DISCHARGE, INCLUDING PHYSICIAN TIME: .>30 MINUTES  Signed: Robbie Lis 08/22/2015, 9:39 AM  Hillis Range MD

## 2015-08-22 NOTE — Discharge Instructions (Signed)
Take protonix daily for 6 weeks  Take eliquis twice daily for 3 months

## 2015-08-22 NOTE — Progress Notes (Signed)
Doing well post ablation  No concerns this am  DC to home  Add PPI x 6 weeks eliquis x 3 months  Follow-up with me in 4-6 weeks   Hillis Range MD, [facility] 08/22/2015 9:20 AM

## 2015-08-25 ENCOUNTER — Telehealth: Payer: Self-pay | Admitting: Internal Medicine

## 2015-08-25 NOTE — Telephone Encounter (Signed)
Discussed with Dr Johney Frame and okay to travel  Patient aware and is doing well

## 2015-08-25 NOTE — Telephone Encounter (Signed)
New message      Pt want to know if he can travel by plane.  He recently had a procedure

## 2015-09-17 ENCOUNTER — Other Ambulatory Visit: Payer: Self-pay | Admitting: Internal Medicine

## 2015-09-29 ENCOUNTER — Ambulatory Visit (INDEPENDENT_AMBULATORY_CARE_PROVIDER_SITE_OTHER): Payer: PRIVATE HEALTH INSURANCE | Admitting: Internal Medicine

## 2015-09-29 ENCOUNTER — Encounter: Payer: Self-pay | Admitting: Internal Medicine

## 2015-09-29 VITALS — BP 130/70 | HR 56 | Ht 70.0 in | Wt 204.0 lb

## 2015-09-29 DIAGNOSIS — I491 Atrial premature depolarization: Secondary | ICD-10-CM | POA: Diagnosis not present

## 2015-09-29 DIAGNOSIS — I471 Supraventricular tachycardia: Secondary | ICD-10-CM

## 2015-09-29 NOTE — Patient Instructions (Signed)
Medication Instructions:  Your physician has recommended you make the following change in your medication:  1) Stop Xarelto on 10/27/15    Labwork: None ordered  Testing/Procedures: None ordered   Follow-Up:  Your physician recommends that you schedule a follow-up appointment in: 3 months with Dr Johney Frame   Any Other Special Instructions Will Be Listed Below (If Applicable).

## 2015-09-29 NOTE — Progress Notes (Signed)
PCP: No PCP Per Patient Primary Cardiologist:  Ross [name] is a 49 y.o. male who presents today for routine electrophysiology followup.  Since his recent ablation, the patient reports doing very well.  He has had rare pacs at rest but no exertional PACs.  He is pleased with his results.  Denies procedure related complications.  Today, he denies symptoms of palpitations, chest pain, shortness of breath,  lower extremity edema, dizziness, presyncope, or syncope.  The patient is otherwise without complaint today.   Past Medical History  Diagnosis Date  . Hyperlipidemia   . Premature atrial contraction   . IBS (irritable bowel syndrome)    Past Surgical History  Procedure Laterality Date  . Inguinal hernia repair  2010  . Electrophysiologic study N/A 08/21/2015    Procedure: A-Tach Ablation;  Surgeon: Ross Range, MD, focus ablated from the RSPV, RSPV/RIPV isolated together ([facility] approach)    ROS- all systems are reviewed and negatives except as per HPI above  Current Outpatient Prescriptions  Medication Sig Dispense Refill  . apixaban (ELIQUIS) 5 MG TABS tablet Take 1 tablet (5 mg total) by mouth 2 (two) times daily. 60 tablet 2   No current facility-administered medications for this visit.    Physical Exam: Filed Vitals:   09/29/15 1541  BP: 130/70  Pulse: 56  Height:  (1.778 m)  Weight: 204 lb (92.534 kg)    GEN- The patient is well appearing, alert and oriented x 3 today.   Head- normocephalic, atraumatic Eyes-  Sclera clear, conjunctiva pink Ears- hearing intact Oropharynx- clear Lungs- Clear to ausculation bilaterally, normal work of breathing Heart- Regular rate and rhythm, no murmurs, rubs or gallops, PMI not laterally displaced GI- soft, NT, ND, + BS Extremities- no clubbing, cyanosis, or edema  ekg today reveals sinus rhythm with nonspecific St/T changes (unchanged from prior)  Assessment and Plan:  1. Atach/ pacs Doing well s/p  ablation Continue eliquis x 4 more weeks then discontinue  Return to see me in 3 months

## 2015-12-31 ENCOUNTER — Encounter: Payer: Self-pay | Admitting: Internal Medicine

## 2015-12-31 ENCOUNTER — Ambulatory Visit (INDEPENDENT_AMBULATORY_CARE_PROVIDER_SITE_OTHER): Payer: PRIVATE HEALTH INSURANCE | Admitting: Internal Medicine

## 2015-12-31 VITALS — BP 132/84 | HR 58 | Ht 70.0 in | Wt 209.4 lb

## 2015-12-31 DIAGNOSIS — I471 Supraventricular tachycardia: Secondary | ICD-10-CM | POA: Diagnosis not present

## 2015-12-31 NOTE — Progress Notes (Signed)
Primary Cardiologist:  Dr Sherrin DaisyBensimhon  Ross [name] is a 50 y.o. male who presents today for routine electrophysiology followup.  Since his recent ablation, the patient reports doing very well.  His arrhythmia is resolved and he is very pleased.  Today, he denies symptoms of palpitations, chest pain, shortness of breath,  lower extremity edema, dizziness, presyncope, or syncope.  The patient is otherwise without complaint today.   Past Medical History  Diagnosis Date  . Hyperlipidemia   . Premature atrial contraction   . IBS (irritable bowel syndrome)    Past Surgical History  Procedure Laterality Date  . Inguinal hernia repair  2010  . Electrophysiologic study N/A 08/21/2015    Procedure: A-Tach Ablation;  Surgeon: Hillis RangeJames Uzair Godley, MD, focus ablated from the RSPV, RSPV/RIPV isolated together ([facility] approach)    ROS- all systems are reviewed and negatives except as per HPI above  No current outpatient prescriptions on file.   No current facility-administered medications for this visit.    Physical Exam: Filed Vitals:   12/31/15 1609  BP: 132/84  Pulse: 58  Height: 5\' 10"  (1.778 m)  Weight: 209 lb 6.4 oz (94.983 kg)    GEN- The patient is well appearing, alert and oriented x 3 today.   Head- normocephalic, atraumatic Eyes-  Sclera clear, conjunctiva pink Ears- hearing intact Oropharynx- clear Lungs- Clear to ausculation bilaterally, normal work of breathing Heart- Regular rate and rhythm, no murmurs, rubs or gallops, PMI not laterally displaced GI- soft, NT, ND, + BS Extremities- no clubbing, cyanosis, or edema  ekg today reveals sinus rhythm with nonspecific St/T changes (unchanged from prior)  Assessment and Plan:  1. Atach/ pacs resolved s/p ablation No further workup planned  Return to see me as needed  Hillis RangeJames Demosthenes Virnig MD, [facility] 12/31/2015 4:31 PM

## 2015-12-31 NOTE — Patient Instructions (Signed)
Medication Instructions:  Your physician recommends that you continue on your current medications as directed. Please refer to the Current Medication list given to you today.  Labwork: None ordered.  Testing/Procedures: None ordered.  Follow-Up: Your physician recommends that you schedule a follow-up appointment as needed.   Any Other Special Instructions Will Be Listed Below (If Applicable).     If you need a refill on your cardiac medications before your next appointment, please call your pharmacy.   

## 2016-04-15 IMAGING — CR DG CHEST 2V
2 series · 2 of 2 positions shown · non-contrast
Comparison: 09/15/2010

CLINICAL DATA: Fever and cough since this morning

EXAM:
CHEST  2 VIEW

[w chest pa]
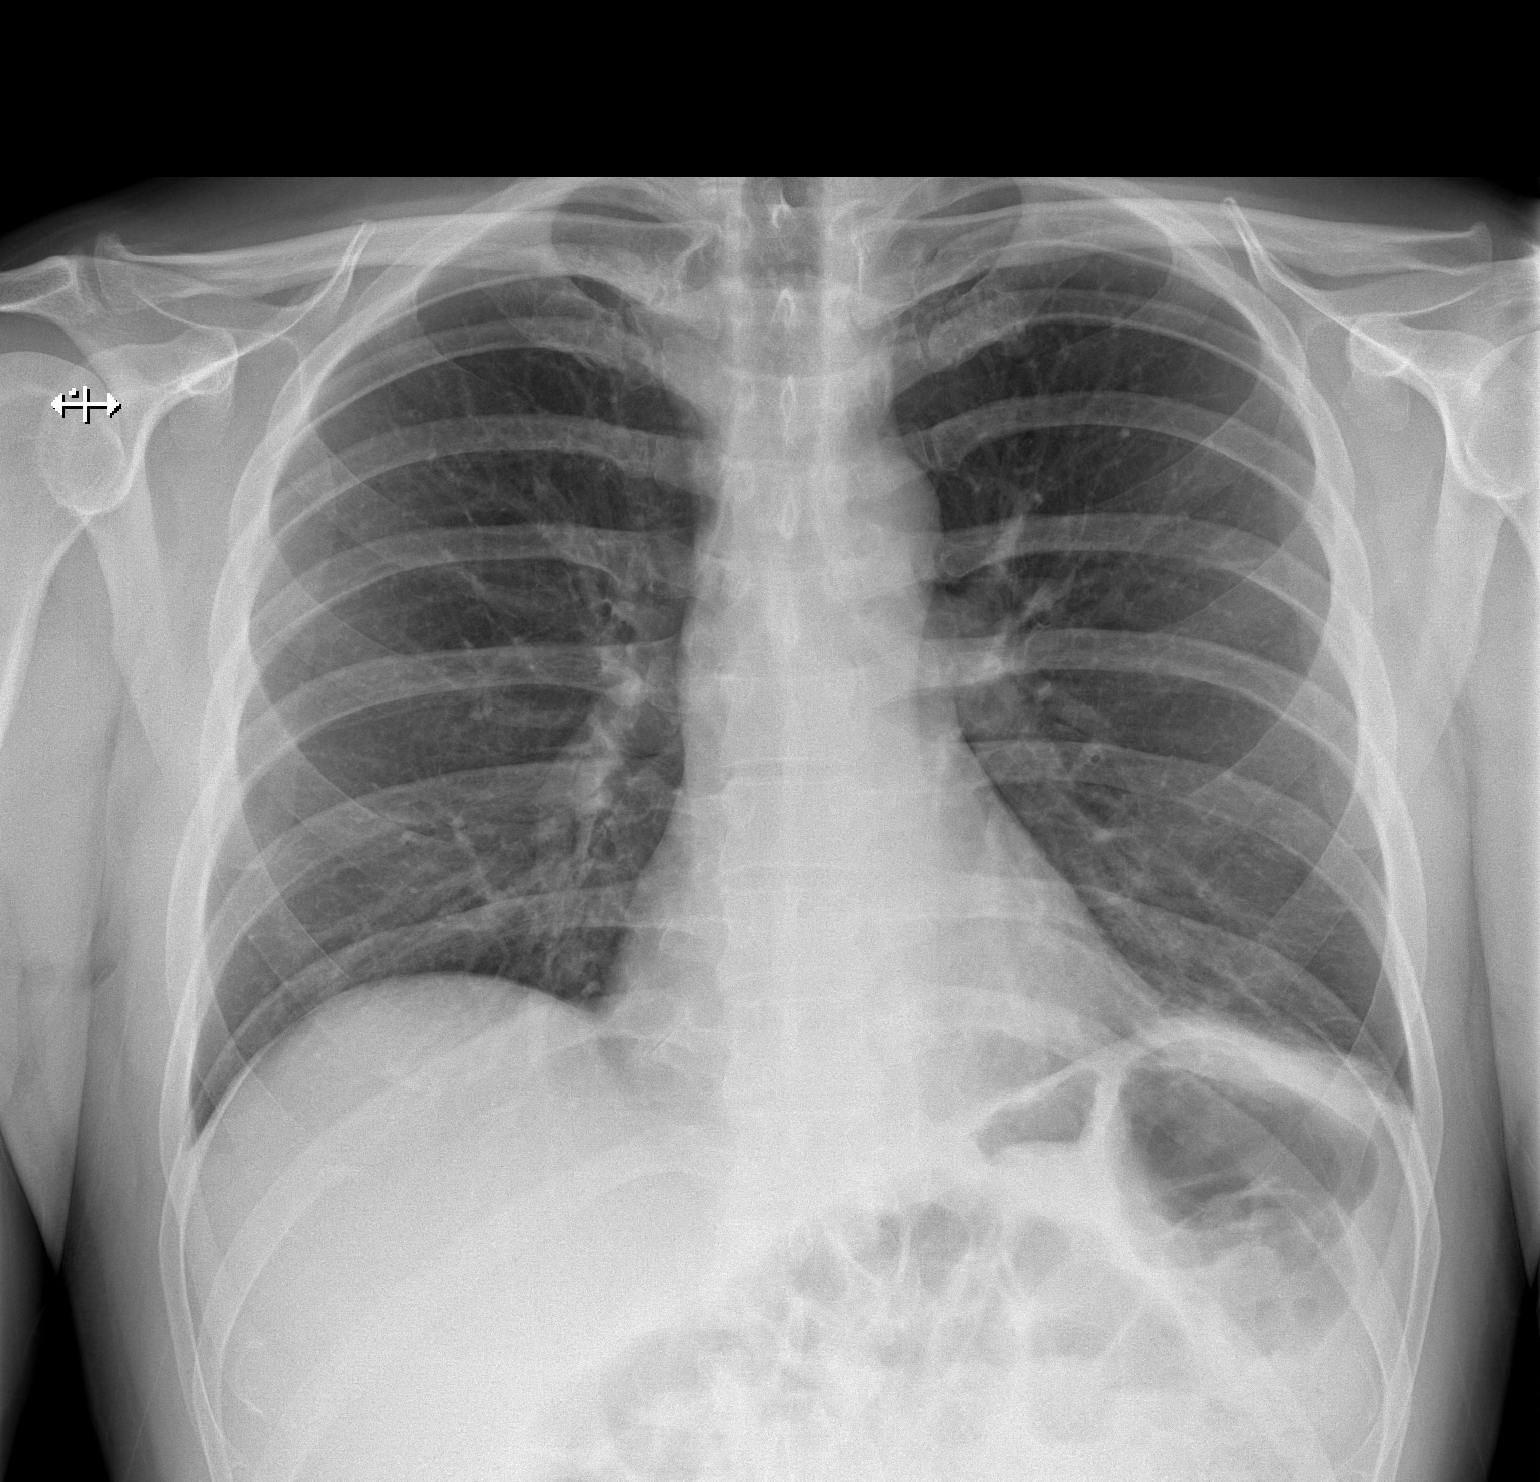

[w chest lat]
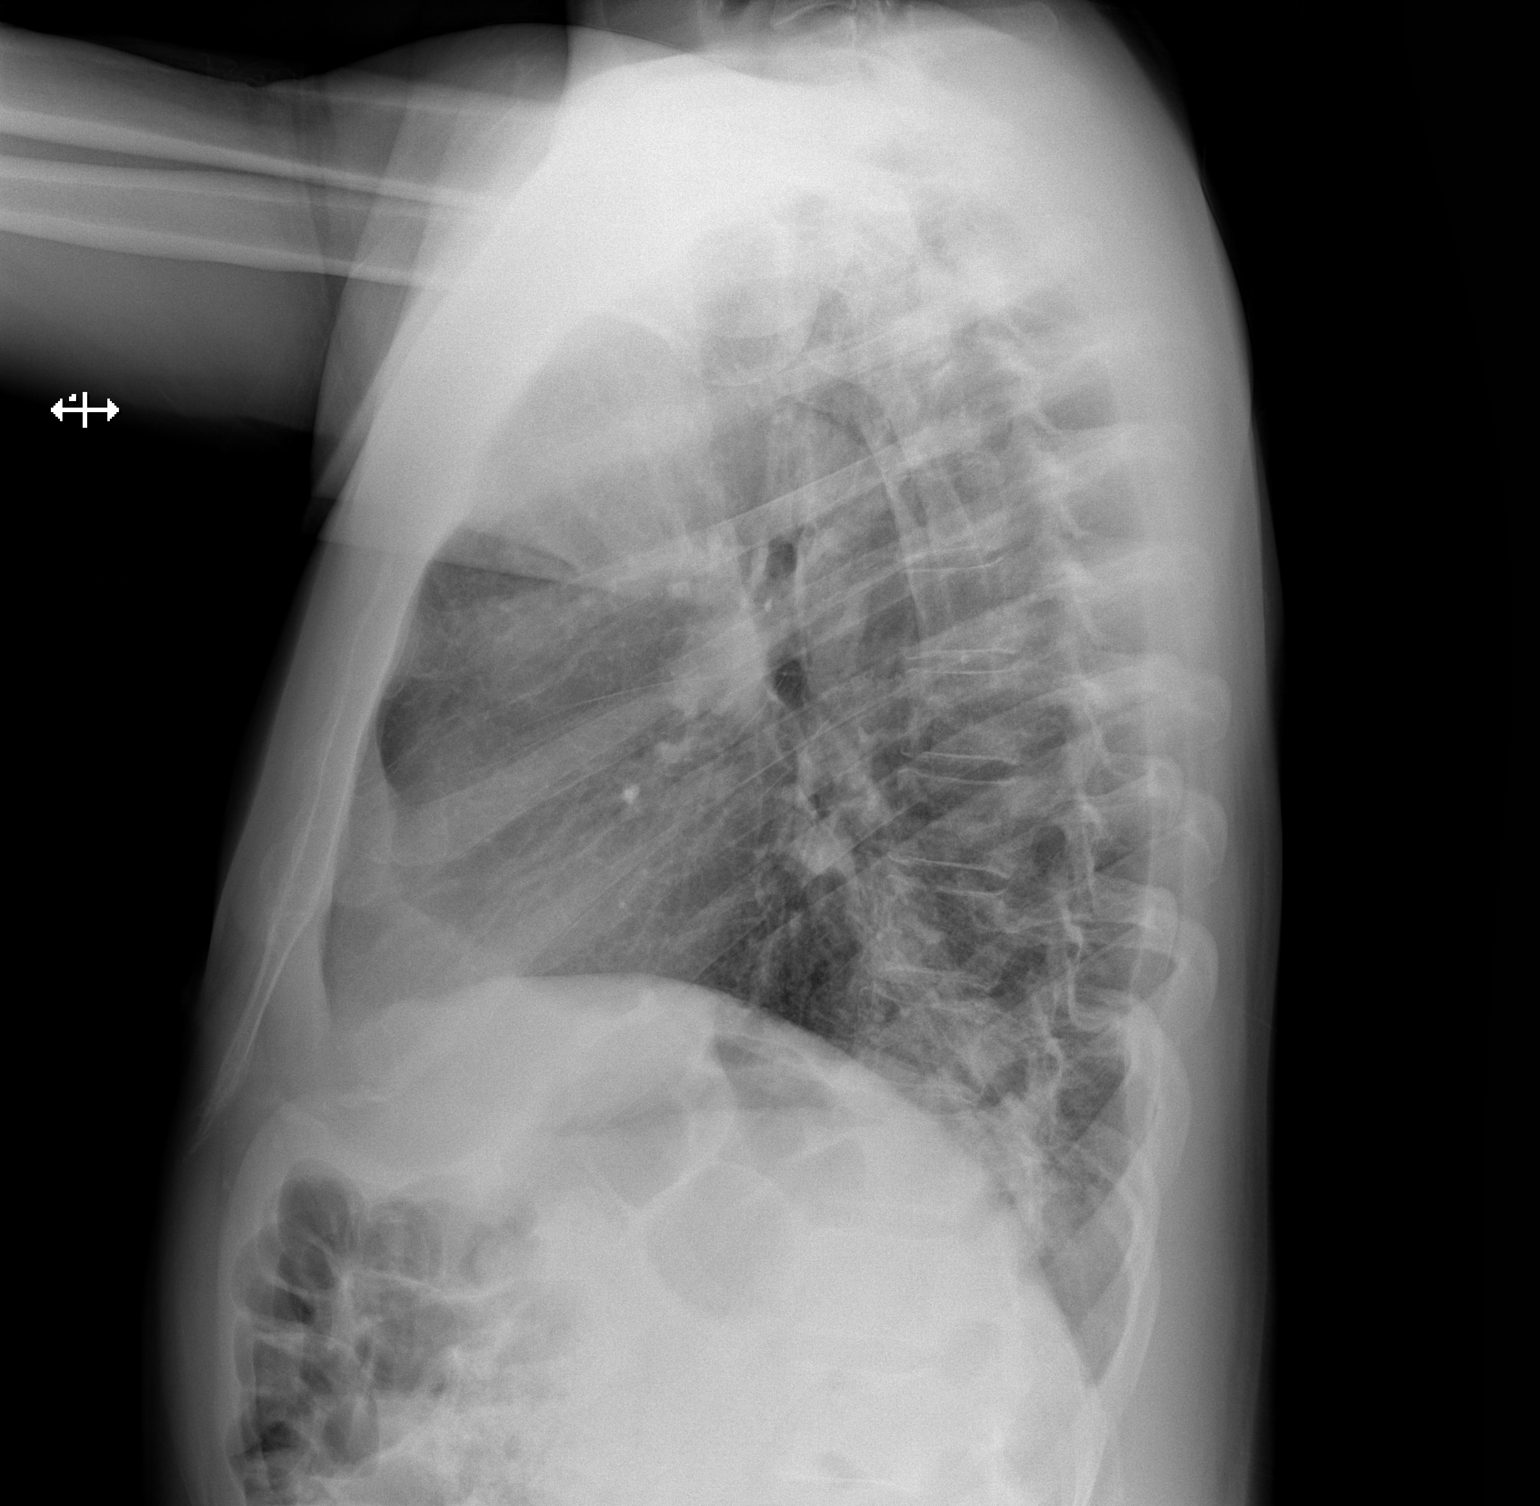

[2 of 2 positions shown; findings below may reference images not displayed]

FINDINGS: Cardiac shadow is within normal limits. The lungs are well aerated
but demonstrate evidence of mild left lower lobe infiltrate. No
sizable effusion is seen. No bony abnormality is noted.
IMPRESSION: Early left lower lobe infiltrate.

## 2024-07-23 ENCOUNTER — Encounter: Payer: Self-pay | Admitting: Family Medicine

## 2024-07-23 ENCOUNTER — Ambulatory Visit: Payer: PRIVATE HEALTH INSURANCE | Admitting: Family Medicine

## 2024-07-23 VITALS — BP 130/80 | Ht 69.0 in | Wt 202.0 lb

## 2024-07-23 DIAGNOSIS — M23307 Other meniscus derangements, unspecified meniscus, left knee: Secondary | ICD-10-CM | POA: Diagnosis not present

## 2024-07-23 DIAGNOSIS — M25562 Pain in left knee: Secondary | ICD-10-CM | POA: Diagnosis not present

## 2024-07-23 NOTE — Progress Notes (Signed)
   Established Patient Office Visit  Subjective   Patient ID: Ross [name], male    DOB: 07-04-1966  Age: 58 y.o. MRN: [id number]  No chief complaint on file.   [name] presents today with medial left knee pain that has been present for about 6 months. There was no inciting event. Pain generally occurs throughout the day and may be worsened by long walks or sleeping in certain positions. There are also occasions in which he moves his leg in a certain way that can cause a sharp pain. He has no issues with riding a bike. He uses Aleve for pain relief. This resolves his pain, usually for greater than 1 day.   Knee Pain  The incident occurred more than 1 week ago. The pain is present in the left knee. The quality of the pain is described as aching. The pain is mild. The pain has been Constant since onset. Pertinent negatives include no loss of motion, loss of sensation, numbness or tingling. The symptoms are aggravated by movement. He has tried NSAIDs for the symptoms. The treatment provided significant relief.       Review of Systems  Musculoskeletal:  Positive for joint pain (Medial left knee).  Neurological:  Negative for tingling, focal weakness and numbness.      Objective:     BP 130/80   Ht 5' 9 (1.753 m)   Wt 202 lb (91.6 kg)   BMI 29.83 kg/m     Physical Exam Musculoskeletal:     Left knee: Crepitus present. No effusion. Normal range of motion. Tenderness present over the medial joint line.     Instability Tests: Medial McMurray test positive.     Comments: Thessaly test negative      No results found for any visits on 07/23/24.     The ASCVD Risk score (Arnett DK, et al., 2019) failed to calculate for the following reasons:   Cannot find a previous HDL lab   Cannot find a previous total cholesterol lab    Assessment & Plan:   Problem List Items Addressed This Visit       Other   Meniscus degeneration, left   Pt presents with dull left knee pain along  the medial joint line that has been present for around 6 months. Pain is worsened with long walks and was precipitated on physical exam with medial McMurray test and palpation of the medial joint line. Thessaly test was negative. Likely due to degeneration of the medial meniscus vs arthritis. Aleve is effective as an analgesic, continue as needed. X-ray ordered to rule out arthritis. Return in 4-6 weeks if no improvement, otherwise as needed.      Other Visit Diagnoses       Left knee pain, unspecified chronicity    -  Primary   Relevant Orders   DG Knee 4 Views W/Patella Left       No follow-ups on file.    Ross [name], Medical Student

## 2024-07-23 NOTE — Patient Instructions (Signed)
 Please get your knee x-rays done at the [facility] at your convenience. Do the home exercises you were given today. Follow up in 4-6 weeks.

## 2024-07-23 NOTE — Assessment & Plan Note (Signed)
 Pt presents with dull left knee pain along the medial joint line that has been present for around 6 months. Pain is worsened with long walks and was precipitated on physical exam with medial McMurray test and palpation of the medial joint line. Thessaly test was negative. Likely due to degeneration of the medial meniscus vs arthritis. Aleve is effective as an analgesic, continue as needed. X-ray ordered to rule out arthritis. Return in 4-6 weeks if no improvement, otherwise as needed.

## 2024-07-30 ENCOUNTER — Ambulatory Visit (HOSPITAL_BASED_OUTPATIENT_CLINIC_OR_DEPARTMENT_OTHER)
Admission: RE | Admit: 2024-07-30 | Discharge: 2024-07-30 | Disposition: A | Discharge status: Home / Self Care | Payer: PRIVATE HEALTH INSURANCE | Source: Ambulatory Visit | Attending: Family Medicine | Admitting: Family Medicine

## 2024-07-30 DIAGNOSIS — M25562 Pain in left knee: Secondary | ICD-10-CM | POA: Insufficient documentation

## 2024-08-08 ENCOUNTER — Ambulatory Visit: Payer: Self-pay | Admitting: Family Medicine

## 2024-08-08 NOTE — Progress Notes (Signed)
 Xrays reviewed, MyChart message sent.

## 2024-12-17 ENCOUNTER — Other Ambulatory Visit: Payer: Self-pay

## 2024-12-17 MED ORDER — MELOXICAM 15 MG PO TABS: 15.0000 mg | ORAL_TABLET | Freq: Every day | ORAL | 0 refills | Status: AC | PRN

## 2024-12-17 MED ORDER — MELOXICAM 15 MG PO TABS: 15.0000 mg | ORAL_TABLET | Freq: Every day | ORAL | 0 refills | Status: DC | PRN

## 2024-12-17 NOTE — Addendum Note (Signed)
 Addended by: MARTHA BOUCHARD E on: 12/17/2024 02:27 PM   Modules accepted: Orders

## 2024-12-17 NOTE — Progress Notes (Signed)
 Pt is asking for medicine for knee pain, Advil and Voltaren gel not helping.   Per Dr. Teressa- can try meloxicam  daily prn. Pt should schedule f/u appt.

## 2024-12-17 NOTE — Progress Notes (Signed)
 Medication resent to [facility] in [place] as pt is traveling for the holidays.

## 2024-12-25 ENCOUNTER — Ambulatory Visit: Payer: PRIVATE HEALTH INSURANCE | Admitting: Family Medicine

## 2024-12-25 ENCOUNTER — Encounter: Payer: Self-pay | Admitting: Family Medicine

## 2024-12-25 VITALS — BP 126/84 | Ht 69.0 in | Wt 210.0 lb

## 2024-12-25 DIAGNOSIS — M1712 Unilateral primary osteoarthritis, left knee: Secondary | ICD-10-CM | POA: Insufficient documentation

## 2024-12-25 NOTE — Progress Notes (Signed)
 DATE OF VISIT: 12/25/2024        Ross [name] DOB: Oct 04, 1966 MRN: [id number]  Discussed the use of AI scribe software for clinical note transcription with the patient, who gave verbal consent to proceed.  History of Present Illness Ross [name] is a 58 year old male with left knee osteoarthritis and meniscal degeneration who presents for follow-up of left knee pain and function after recent travel and activity. Last seen by me 07/23/24  Left knee pain and mechanical symptoms - Intermittent left medial knee pain, primarily during activity - Occasional clicking and popping sensations in the left knee, not bothersome and sometimes provides transient relief - No swelling, ecchymosis, or changes in knee appearance  Recent exacerbation with travel and activity - Increased left knee discomfort during recent travel to [place] , associated with snowboarding - Pain most pronounced upon waking the first morning at altitude, accompanied by mild headache - Exercised caution due to suboptimal snow conditions, which may have increased stress on the knee  Supportive measures and response to treatment - Uses a compression sleeve with plastic support during activity, especially snowboarding, with perceived benefit - Intermittent use of meloxicam  for pain control, especially prior to increased activity such as hiking or travel, with good response - No injections or additional interventions required since returning home - Currently feels well  Physical activity and impact on symptoms - Avid cyclist, uses bike trainer in winter and transitions to outdoor cycling in spring - Cycling does not exacerbate knee pain - Occasional use of sleeve for additional support during cycling if needed    Medications:  Outpatient Encounter Medications as of 12/25/2024  Medication Sig   meloxicam  (MOBIC ) 15 MG tablet Take 1 tablet (15 mg total) by mouth daily as needed for pain. Take with food.   No  facility-administered encounter medications on file as of 12/25/2024.    Allergies: has no known allergies.  Physical Examination: Vitals: BP 126/84   Ht 5' 9 (1.753 m)   Wt 210 lb (95.3 kg)   BMI 31.01 kg/m  GENERAL:  Ross [name] is a 58 y.o. male appearing their stated age, alert and oriented x 3, in no apparent distress.  SKIN: no rashes or lesions, skin clean, dry, intact MSK: Left knee not swelling or effusion, no increased redness or warmth.  Full range of motion without pain.  Minimal tenderness to palpation along the medial joint line.  No lateral joint line tenderness.  Negative Murray.  Negative Lachman.  Negative varus and valgus stress. Walking without a limp Neurovascular intact distally  Radiology: Left knee x-ray 07/30/2024 showing: IMPRESSION: Mild tricompartmental osteoarthritis with small joint effusion.  Assessment & Plan Left knee osteoarthritis with meniscal degeneration Mild osteoarthritis with likely meniscal degeneration. Symptoms manageable with meloxicam  and activity modification. Not a candidate for joint replacement or corticosteroid injections due to mild severity and adequate symptom control. - Reviewed x-ray findings confirming mild osteoarthritis - Recommended continued intermittent meloxicam  for symptom control. - Advised use of compression sleeve or brace during activities if symptomatic. - Reiterated that corticosteroid injections are not indicated due to adequate symptom control and potential joint degradation.  Could consider if symptoms worsen - Discussed viscosupplementation or MRI if symptoms worsen or become refractory. - Encouraged continuation of low-impact exercise, particularly cycling. - Provided anticipatory guidance on potential symptom flares and conditional escalation to injection therapy or advanced imaging if pain increases. - Advised to contact the office via MyChart or phone if symptoms worsen or  with further  questions.     Patient expressed understanding & agreement with above.  Encounter Diagnosis  Name Primary?   Primary osteoarthritis of left knee Yes    No orders of the defined types were placed in this encounter.    Contains text generated by [employer].
